# Patient Record
Sex: Male | Born: 1985
Health system: Southern US, Community
[De-identification: ages and names within clinical notes are randomized; demographics above are authoritative.]

## PROBLEM LIST (undated history)

## (undated) DIAGNOSIS — Z6830 Body mass index (BMI) 30.0-30.9, adult: Secondary | ICD-10-CM

## (undated) DIAGNOSIS — I10 Essential (primary) hypertension: Secondary | ICD-10-CM

## (undated) HISTORY — DX: Body mass index (BMI) 30.0-30.9, adult: Z68.30

## (undated) HISTORY — PX: WISDOM TOOTH EXTRACTION: SHX21

---

## 2015-04-12 ENCOUNTER — Emergency Department (HOSPITAL_COMMUNITY): Payer: 59

## 2015-04-12 ENCOUNTER — Emergency Department (HOSPITAL_COMMUNITY)
Admission: EM | Admit: 2015-04-12 | Discharge: 2015-04-12 | Disposition: A | Discharge status: Home / Self Care | Payer: 59 | Attending: Emergency Medicine | Admitting: Emergency Medicine

## 2015-04-12 ENCOUNTER — Encounter (HOSPITAL_COMMUNITY): Payer: Self-pay

## 2015-04-12 DIAGNOSIS — M542 Cervicalgia: Secondary | ICD-10-CM | POA: Diagnosis present

## 2015-04-12 DIAGNOSIS — I10 Essential (primary) hypertension: Secondary | ICD-10-CM | POA: Insufficient documentation

## 2015-04-12 DIAGNOSIS — Z79899 Other long term (current) drug therapy: Secondary | ICD-10-CM | POA: Diagnosis not present

## 2015-04-12 HISTORY — DX: Essential (primary) hypertension: I10

## 2015-04-12 LAB — I-STAT CHEM 8, ED
BUN: 17 mg/dL (ref 6–20)
Calcium, Ion: 1.18 mmol/L (ref 1.12–1.23)
Chloride: 104 mmol/L (ref 101–111)
Creatinine, Ser: 1.3 mg/dL — ABNORMAL HIGH (ref 0.61–1.24)
Glucose, Bld: 78 mg/dL (ref 65–99)
HCT: 44 % (ref 39.0–52.0)
Hemoglobin: 15 g/dL (ref 13.0–17.0)
Potassium: 3.9 mmol/L (ref 3.5–5.1)
Sodium: 142 mmol/L (ref 135–145)
TCO2: 27 mmol/L (ref 0–100)

## 2015-04-12 LAB — URINALYSIS, ROUTINE W REFLEX MICROSCOPIC
Bilirubin Urine: NEGATIVE
Glucose, UA: NEGATIVE mg/dL
Hgb urine dipstick: NEGATIVE
Ketones, ur: NEGATIVE mg/dL
Leukocytes, UA: NEGATIVE
Nitrite: NEGATIVE
Protein, ur: NEGATIVE mg/dL
Specific Gravity, Urine: 1.026 (ref 1.005–1.030)
pH: 7.5 (ref 5.0–8.0)

## 2015-04-12 MED ORDER — IBUPROFEN 800 MG PO TABS: 800.0000 mg | ORAL_TABLET | Freq: Three times a day (TID) | ORAL | Status: DC

## 2015-04-12 MED ORDER — CYCLOBENZAPRINE HCL 10 MG PO TABS: 10.0000 mg | ORAL_TABLET | Freq: Every day | ORAL | Status: DC

## 2015-04-12 NOTE — ED Notes (Addendum)
Pt here with neck pain since Friday.  Pt does not recall an injury.  Woke up with the pain.  Pt able to turn neck in triage.  However, no full normal rotation.  No fever or other symptoms. Pt wants blood and urine test.  Pt states he always gotten up at night x 2 since high school.  Chronic symptom since. No recent changes in that.  Was told that he needs to have that checked in the future.  Pt also wanted routine blood work. When told that we do not do routine blood work, pt proceeded to get a Dr. >>>>  On the phone that he knows.  Not a provider for patient's care but a friend.  She did not know that we do not do routine blood work here and felt pt neck pain could be meningitis.

## 2015-04-12 NOTE — ED Provider Notes (Signed)
History  By signing my name below, I, Joseph Santiago, attest that this documentation has been prepared under the direction and in the presence of Joseph Loan, PA-C. Electronically Signed: Marlowe Santiago, ED Scribe. 04/12/2015. 3:18 PM  Chief Complaint  Patient presents with  . Neck Pain   The history is provided by the patient and medical records. No language interpreter was used.    HPI Comments:  Joseph Santiago is a 30 y.o. male who presents to the Emergency Department complaining of neck pain that began two days ago. He reports gradual onset moderate HA four days ago that has resolved.  Denies thunderclap headache.  He states he woke up three days ago with neck stiffness and pain. He states he received a massage yesterday with no significant relief of the symptoms. He applied [facility] to the area with minimal relief and has taken Aleve. Attempting to turn his neck left and right increase the pain. He denies alleviating factors. He denies fever, chills, nausea, vomiting, confusion, disorientation, cough, sore throat, abdominal pain, numbness, tingling or weakness of any extremity. He denies any trauma, injury or fall. He denies any recent chiropractic care. Pt also expresses concern of frequent urination for the past nine years. He denies hematuria or dysuria. He requests urinalysis and has a list of blood tests he is requesting.  Past Medical History  Diagnosis Date  . Hypertension    History reviewed. No pertinent past surgical history. History reviewed. No pertinent family history. Social History  Substance Use Topics  . Smoking status: Never Smoker   . Smokeless tobacco: None  . Alcohol Use: No    Review of Systems A complete 10 system review of systems was obtained and all systems are negative except as noted in the HPI and PMH.   Allergies  Bactrim  Home Medications   Prior to Admission medications   Medication Sig Start Date End Date Taking? Authorizing Provider  b  complex vitamins tablet Take 1 tablet by mouth daily.   Yes Historical Provider, MD  Multiple Vitamins-Minerals (MULTIVITAMIN WITH MINERALS) tablet Take 1 tablet by mouth daily.   Yes Historical Provider, MD  cyclobenzaprine (FLEXERIL) 10 MG tablet Take 1 tablet (10 mg total) by mouth at bedtime. 04/12/15   Joseph Loan, PA-C  ibuprofen (ADVIL,MOTRIN) 800 MG tablet Take 1 tablet (800 mg total) by mouth 3 (three) times daily. 04/12/15   Joseph Loan, PA-C   Triage Vitals: BP 118/75 mmHg  Pulse 68  Temp(Src) 98.6 F (37 C) (Oral)  Resp 14  SpO2 99% Physical Exam  Constitutional: He is oriented to person, place, and time. He appears well-developed and well-nourished.  Non-toxic appearance. He does not have a sickly appearance. He does not appear ill.  HENT:  Head: Normocephalic and atraumatic.  Mouth/Throat: Oropharynx is clear and moist.  Eyes: Conjunctivae are normal. Pupils are equal, round, and reactive to light.  Neck: Normal range of motion. Neck supple.  Cardiovascular: Normal rate, regular rhythm and normal heart sounds.   No murmur heard. Pulmonary/Chest: Effort normal and breath sounds normal. No accessory muscle usage or stridor. No respiratory distress. He has no wheezes. He has no rhonchi. He has no rales.  Abdominal: Soft. Bowel sounds are normal. He exhibits no distension. There is no tenderness.  Musculoskeletal: Normal range of motion.  Lymphadenopathy:    He has no cervical adenopathy.  Neurological: He is alert and oriented to person, place, and time.  Speech clear without dysarthria.  Cranial nerves grossly  intact.  Strength and sensation intact throughout upper and lower extremities.  No pronator drift. Gait normal.   Skin: Skin is warm and dry.  Psychiatric: He has a normal mood and affect. His behavior is normal.    ED Course  Procedures (including critical care time) DIAGNOSTIC STUDIES: Oxygen Saturation is 99% on RA, normal by my interpretation.   COORDINATION OF  CARE: 1:56 PM- Will order urinalysis and C-Spine X-Ray. Advised pt to follow up with primary care to get requested blood tests. Offered pt Ibuprofen prior to imaging but he declined. Pt verbalizes understanding and agrees to plan.  Medications - No data to display  Labs Review Labs Reviewed  URINALYSIS, ROUTINE W REFLEX MICROSCOPIC (NOT AT [facility]) - Abnormal; Notable for the following:    APPearance CLOUDY (*)    All other components within normal limits  I-STAT CHEM 8, ED - Abnormal; Notable for the following:    Creatinine, Ser 1.30 (*)    All other components within normal limits    Imaging Review Dg Cervical Spine Complete  04/12/2015  CLINICAL DATA:  One day history of cervicalgia EXAM: CERVICAL SPINE - COMPLETE 4+ VIEW COMPARISON:  None. FINDINGS: Frontal, lateral, open-mouth odontoid, and bilateral oblique views were obtained. There is no fracture or spondylolisthesis. Prevertebral soft tissues and predental space regions are normal. The disc spaces appear normal. There is no appreciable exit foraminal narrowing on the oblique views. IMPRESSION: No fracture or spondylolisthesis.  No apparent arthropathy. Electronically Signed   By: Lowella Grip III M.D.   On: 04/12/2015 14:47   I have personally reviewed and evaluated these images and lab results as part of my medical decision-making.   EKG Interpretation None      MDM   Final diagnoses:  Neck pain    Healthy male with neck pain.  Doubt SAH. Headache has resolved, and was not sudden thunderclap in nature. Normal neuro exam.  Doubt meningitis.  VSS, NAD.  No systemic symptoms.  I suspect this is musculoskeletal.  Labs unremarkable. Plain films of cervical spine negative. Plan to discharge home with flexeril and ibuprofen. Follow up PCP. Discussed return precautions. Patient agrees an Engineer, structural the above plan for discharge.Case has been discussed with Dr. Audie Pinto who agrees with the above plan for discharge.    I  personally performed the services described in this documentation, which was scribed in my presence. The recorded information has been reviewed and is accurate.     Joseph Loan, PA-C 04/12/15 1537  Leonard Schwartz, MD 04/14/15 [id number]

## 2015-04-12 NOTE — Discharge Instructions (Signed)
Cervical Strain and Sprain With Rehab  Cervical strain and sprain are injuries that commonly occur with "whiplash" injuries. Whiplash occurs when the neck is forcefully whipped backward or forward, such as during a motor vehicle accident or during contact sports. The muscles, ligaments, tendons, discs, and nerves of the neck are susceptible to injury when this occurs.  RISK FACTORS  Risk of having a whiplash injury increases if:  · Osteoarthritis of the spine.  · Situations that make head or neck accidents or trauma more likely.  · High-risk sports (football, rugby, wrestling, hockey, auto racing, gymnastics, diving, contact karate, or boxing).  · Poor strength and flexibility of the neck.  · Previous neck injury.  · Poor tackling technique.  · Improperly fitted or padded equipment.  SYMPTOMS   · Pain or stiffness in the front or back of neck or both.  · Symptoms may present immediately or up to 24 hours after injury.  · Dizziness, headache, nausea, and vomiting.  · Muscle spasm with soreness and stiffness in the neck.  · Tenderness and swelling at the injury site.  PREVENTION  · Learn and use proper technique (avoid tackling with the head, spearing, and head-butting; use proper falling techniques to avoid landing on the head).  · Warm up and stretch properly before activity.  · Maintain physical fitness:    Strength, flexibility, and endurance.    Cardiovascular fitness.  · Wear properly fitted and padded protective equipment, such as padded soft collars, for participation in contact sports.  PROGNOSIS   Recovery from cervical strain and sprain injuries is dependent on the extent of the injury. These injuries are usually curable in 1 week to 3 months with appropriate treatment.   RELATED COMPLICATIONS   · Temporary numbness and weakness may occur if the nerve roots are damaged, and this may persist until the nerve has completely healed.  · Chronic pain due to frequent recurrence of symptoms.  · Prolonged healing,  especially if activity is resumed too soon (before complete recovery).  TREATMENT   Treatment initially involves the use of ice and medication to help reduce pain and inflammation. It is also important to perform strengthening and stretching exercises and modify activities that worsen symptoms so the injury does not get worse. These exercises may be performed at home or with a therapist. For patients who experience severe symptoms, a soft, padded collar may be recommended to be worn around the neck.   Improving your posture may help reduce symptoms. Posture improvement includes pulling your chin and abdomen in while sitting or standing. If you are sitting, sit in a firm chair with your buttocks against the back of the chair. While sleeping, try replacing your pillow with a small towel rolled to 2 inches in diameter, or use a cervical pillow or soft cervical collar. Poor sleeping positions delay healing.   For patients with nerve root damage, which causes numbness or weakness, the use of a cervical traction apparatus may be recommended. Surgery is rarely necessary for these injuries. However, cervical strain and sprains that are present at birth (congenital) may require surgery.  MEDICATION   · If pain medication is necessary, nonsteroidal anti-inflammatory medications, such as aspirin and ibuprofen, or other minor pain relievers, such as acetaminophen, are often recommended.  · Do not take pain medication for 7 days before surgery.  · Prescription pain relievers may be given if deemed necessary by your caregiver. Use only as directed and only as much as you need.    HEAT AND COLD:   · Cold treatment (icing) relieves pain and reduces inflammation. Cold treatment should be applied for 10 to 15 minutes every 2 to 3 hours for inflammation and pain and immediately after any activity that aggravates your symptoms. Use ice packs or an ice massage.  · Heat treatment may be used prior to performing the stretching and  strengthening activities prescribed by your caregiver, physical therapist, or athletic trainer. Use a heat pack or a warm soak.  SEEK MEDICAL CARE IF:   · Symptoms get worse or do not improve in 2 weeks despite treatment.  · New, unexplained symptoms develop (drugs used in treatment may produce side effects).  EXERCISES  RANGE OF MOTION (ROM) AND STRETCHING EXERCISES - Cervical Strain and Sprain  These exercises may help you when beginning to rehabilitate your injury. In order to successfully resolve your symptoms, you must improve your posture. These exercises are designed to help reduce the forward-head and rounded-shoulder posture which contributes to this condition. Your symptoms may resolve with or without further involvement from your physician, physical therapist or athletic trainer. While completing these exercises, remember:   · Restoring tissue flexibility helps normal motion to return to the joints. This allows healthier, less painful movement and activity.  · An effective stretch should be held for at least 20 seconds, although you may need to begin with shorter hold times for comfort.  · A stretch should never be painful. You should only feel a gentle lengthening or release in the stretched tissue.  STRETCH- Axial Extensors  · Lie on your back on the floor. You may bend your knees for comfort. Place a rolled-up hand towel or dish towel, about 2 inches in diameter, under the part of your head that makes contact with the floor.  · Gently tuck your chin, as if trying to make a "double chin," until you feel a gentle stretch at the base of your head.  · Hold __________ seconds.  Repeat __________ times. Complete this exercise __________ times per day.   STRETCH - Axial Extension   · Stand or sit on a firm surface. Assume a good posture: chest up, shoulders drawn back, abdominal muscles slightly tense, knees unlocked (if standing) and feet hip width apart.  · Slowly retract your chin so your head slides back  and your chin slightly lowers. Continue to look straight ahead.  · You should feel a gentle stretch in the back of your head. Be certain not to feel an aggressive stretch since this can cause headaches later.  · Hold for __________ seconds.  Repeat __________ times. Complete this exercise __________ times per day.  STRETCH - Cervical Side Bend   · Stand or sit on a firm surface. Assume a good posture: chest up, shoulders drawn back, abdominal muscles slightly tense, knees unlocked (if standing) and feet hip width apart.  · Without letting your nose or shoulders move, slowly tip your right / left ear to your shoulder until your feel a gentle stretch in the muscles on the opposite side of your neck.  · Hold __________ seconds.  Repeat __________ times. Complete this exercise __________ times per day.  STRETCH - Cervical Rotators   · Stand or sit on a firm surface. Assume a good posture: chest up, shoulders drawn back, abdominal muscles slightly tense, knees unlocked (if standing) and feet hip width apart.  · Keeping your eyes level with the ground, slowly turn your head until you feel a gentle stretch along   the back and opposite side of your neck.  · Hold __________ seconds.  Repeat __________ times. Complete this exercise __________ times per day.  RANGE OF MOTION - Neck Circles   · Stand or sit on a firm surface. Assume a good posture: chest up, shoulders drawn back, abdominal muscles slightly tense, knees unlocked (if standing) and feet hip width apart.  · Gently roll your head down and around from the back of one shoulder to the back of the other. The motion should never be forced or painful.  · Repeat the motion 10-20 times, or until you feel the neck muscles relax and loosen.  Repeat __________ times. Complete the exercise __________ times per day.  STRENGTHENING EXERCISES - Cervical Strain and Sprain  These exercises may help you when beginning to rehabilitate your injury. They may resolve your symptoms with or  without further involvement from your physician, physical therapist, or athletic trainer. While completing these exercises, remember:   · Muscles can gain both the endurance and the strength needed for everyday activities through controlled exercises.  · Complete these exercises as instructed by your physician, physical therapist, or athletic trainer. Progress the resistance and repetitions only as guided.  · You may experience muscle soreness or fatigue, but the pain or discomfort you are trying to eliminate should never worsen during these exercises. If this pain does worsen, stop and make certain you are following the directions exactly. If the pain is still present after adjustments, discontinue the exercise until you can discuss the trouble with your clinician.  STRENGTH - Cervical Flexors, Isometric  · Face a wall, standing about 6 inches away. Place a small pillow, a ball about 6-8 inches in diameter, or a folded towel between your forehead and the wall.  · Slightly tuck your chin and gently push your forehead into the soft object. Push only with mild to moderate intensity, building up tension gradually. Keep your jaw and forehead relaxed.  · Hold 10 to 20 seconds. Keep your breathing relaxed.  · Release the tension slowly. Relax your neck muscles completely before you start the next repetition.  Repeat __________ times. Complete this exercise __________ times per day.  STRENGTH- Cervical Lateral Flexors, Isometric   · Stand about 6 inches away from a wall. Place a small pillow, a ball about 6-8 inches in diameter, or a folded towel between the side of your head and the wall.  · Slightly tuck your chin and gently tilt your head into the soft object. Push only with mild to moderate intensity, building up tension gradually. Keep your jaw and forehead relaxed.  · Hold 10 to 20 seconds. Keep your breathing relaxed.  · Release the tension slowly. Relax your neck muscles completely before you start the next  repetition.  Repeat __________ times. Complete this exercise __________ times per day.  STRENGTH - Cervical Extensors, Isometric   · Stand about 6 inches away from a wall. Place a small pillow, a ball about 6-8 inches in diameter, or a folded towel between the back of your head and the wall.  · Slightly tuck your chin and gently tilt your head back into the soft object. Push only with mild to moderate intensity, building up tension gradually. Keep your jaw and forehead relaxed.  · Hold 10 to 20 seconds. Keep your breathing relaxed.  · Release the tension slowly. Relax your neck muscles completely before you start the next repetition.  Repeat __________ times. Complete this exercise __________ times per day.    All of your joints have less wear and tear when properly supported by a spine with good posture. This means you will experience a healthier, less painful body.  Correct posture must be practiced with all of your activities, especially prolonged sitting and standing. Correct posture is as important when doing repetitive low-stress activities (typing) as it is when doing a single heavy-load activity (lifting). PROLONGED STANDING WHILE SLIGHTLY LEANING FORWARD When completing a task that requires you to lean forward while standing in one  place for a long time, place either foot up on a stationary 2- to 4-inch high object to help maintain the best posture. When both feet are on the ground, the low back tends to lose its slight inward curve. If this curve flattens (or becomes too large), then the back and your other joints will experience too much stress, fatigue more quickly, and can cause pain.  RESTING POSITIONS Consider which positions are most painful for you when choosing a resting position. If you have pain with flexion-based activities (sitting, bending, stooping, squatting), choose a position that allows you to rest in a less flexed posture. You would want to avoid curling into a fetal position on your side. If your pain worsens with extension-based activities (prolonged standing, working overhead), avoid resting in an extended position such as sleeping on your stomach. Most people will find more comfort when they rest with their spine in a more neutral position, neither too rounded nor too arched. Lying on a non-sagging bed on your side with a pillow between your knees, or on your back with a pillow under your knees will often provide some relief. Keep in mind, being in any one position for a prolonged period of time, no matter how correct your posture, can still lead to stiffness. WALKING Walk with an upright posture. Your ears, shoulders, and hips should all line up. OFFICE WORK When working at a desk, create an environment that supports good, upright posture. Without extra support, muscles fatigue and lead to excessive strain on joints and other tissues. CHAIR:  A chair should be able to slide under your desk when your back makes contact with the back of the chair. This allows you to work closely.  The chair's height should allow your eyes to be level with the upper part of your monitor and your hands to be slightly lower than your elbows.  Body position:  Your feet should make contact with the floor. If this is not  possible, use a foot rest.  Keep your ears over your shoulders. This will reduce stress on your neck and low back.   This information is not intended to replace advice given to you by your health care provider. Make sure you discuss any questions you have with your health care provider.   Document Released: 02/14/2005 Document Revised: 03/07/2014 Document Reviewed: 05/29/2008 Elsevier Interactive Patient Education 2016 [employer].   Emergency Department Resource Guide 1) Find a Doctor and Pay Out of Pocket Although you won't have to find out who is covered by your insurance plan, it is a good idea to ask around and get recommendations. You will then need to call the office and see if the doctor you have chosen will accept you as a new patient and what types of options they offer for patients who are self-pay. Some doctors offer discounts or will set up payment plans for their patients who do not have insurance, but you will need to ask so you aren't surprised when  you get to your appointment.  2) Contact Your Local Health Department Not all health departments have doctors that can see patients for sick visits, but many do, so it is worth a call to see if yours does. If you don't know where your local health department is, you can check in your phone book. The CDC also has a tool to help you locate your state's health department, and many state websites also have listings of all of their local health departments.  3) Find a [facility] If your illness is not likely to be very severe or complicated, you may want to try a walk in clinic. These are popping up all over the country in pharmacies, drugstores, and shopping centers. They're usually staffed by nurse practitioners or physician assistants that have been trained to treat common illnesses and complaints. They're usually fairly quick and inexpensive. However, if you have serious medical issues or chronic medical problems, these are probably  not your best option.  No Primary Care Doctor: - Call Health Connect at  [phone] - they can help you locate a primary care doctor that  accepts your insurance, provides certain services, etc. - Physician Referral Service- [phone]  Chronic Pain Problems: Organization         Address  Phone   Notes  [facility]  [phone] Patients need to be referred by their primary care doctor.   Medication Assistance: Organization         Address  Phone   Notes  [facility] Medication [facility] [address] [place], [place] [address] [phone] --Must be a resident of [facility] -- Must have NO insurance coverage whatsoever (no Medicaid/ Medicare, etc.) -- The pt. MUST have a primary care doctor that directs their care regularly and follows them in the community   Med[facility]ist  [phone]   [employer]  [phone]    Agencies that provide inexpensive medical care: Organization         Address  Phone   Notes  [place]  [phone]   [facility]    [phone]   [facility] [place], [place] [address] [phone]   [place] [address] [place] [phone]   Planned Parenthood    [phone]   [facility]    [phone]   [place] [address], [place] Phone:  [phone], Fax:  [phone] Hours of Operation:  9 am - 6 pm, M-F.  Also accepts Medicaid/Medicare and self-pay.  [facility] [place], [address], [place] Phone: [phone], Fax: [phone]. Hours of Operation:  8:30 am - 5:30 pm, M-F.  Also accepts Medicaid and self-pay.  [facility] [place] [address], [place] Phone: [phone]   [place], [place], [place] [phone], Ext. [phone] Mondays & Thursdays: 7-9 AM.   First 15 patients are seen on a first come, first serve basis.    [place] Providers:  Organization         Address  Phone   Notes  [facility] [address],  [phone] Also accepts self-pay patients.  [place], [place]  [phone]   [place]  [place], [address], [place] [phone]   [place] [address], [place] [phone]   Lucianne Lei [address], [place]   [phone] Only accepts [place] Access [place] patients after they have their name applied to their card.   Self-Pay (no insurance) in [facility]:  Organization         Address  Phone   Notes  Sickle Cell Patients, [facility] [place] [phone]   [facility] [place] [phone]   [facility] [place]  [place], [place], [place] [phone]   [facility]/Dr. Osei-Bonsu  [address] [place] or [address], [place] [phone] Phone number for both [place] and [place] locations is the same.  Urgent Medical and [facility] [address], [place] [phone]   [facility] [address], [place] or [address] [phone]   [facility] [address], [place] [phone], phone; [phone], fax Sees patients 1st and 3rd Saturday of every month.  Must not qualify for public or private insurance (i.e. Medicaid, Medicare, [place] Health Choice, Veterans' Benefits)  Household income should be no more than 200% of the poverty level The clinic cannot treat you if you are pregnant or think you are pregnant  Sexually transmitted diseases are not treated at the clinic.    Dental  Care: Organization         Address  Phone  Notes  [facility] [place] [phone] Accepts children up to age 45 who are enrolled in [place] or [place]; pregnant women with a Medicaid card; and children who have applied for Medicaid or [place] Health Choice, but were declined, whose parents can pay a reduced fee at time of service.  [facility] Department of [facility]  [address], [place] [phone] Accepts children up to age 36 who are enrolled in [place] or [place]; pregnant women with a Medicaid card; and children who have applied for Medicaid or [place] Health Choice, but were declined, whose parents can pay a reduced fee at time of service.  [place] Adult Dental Access PROGRAM  [place] [phone] Patients are seen by appointment only. Walk-ins are not accepted. [place] will see patients 32 years of age and older. Monday - Tuesday (8am-5pm) Most Wednesdays (8:30-5pm) $30 per visit, cash only  [facility] Access PRO[facility]  [address], [facility] [phone] Patients are seen by appointment only. Walk-ins are not accepted. [place] will see patients 42 years of age and older. One Wednesday Evening (Monthly: Volunteer Based).  $30 per visit, cash only  [place]  [phone] for adults; Children under age 54, call Graduate Pediatric Dentistry at [phone]. Children aged 43-14, please call [phone] to request a pediatric application.  Dental services are provided in all areas of dental care including fillings, crowns and bridges, complete and partial dentures, implants, gum treatment, root canals, and extractions. Preventive care is also provided. Treatment is provided to both adults and children. Patients are selected via a lottery and there is often a waiting list.   [phone]  [facility] [address] [place]  [phone] [email or web address]   [facility] [address] [place], [place] [phone], Ext. [phone] Second and Fourth Thursday of each month, opens at 6:30 AM; Clinic ends at 9 AM.  Patients are seen on a first-come first-served basis, and a limited number are seen during each clinic.   [facility]  [address] [place], [place] [phone]   Eligibility Requirements You must have lived in [place], [place], or [place] counties for at least the last three months.   You cannot be eligible for state or federal sponsored [employer], including [employer], [place], or [employer].   You generally cannot be eligible for healthcare insurance through your employer.    How to apply: Eligibility screenings are held every Tuesday and Wednesday afternoon from 1:00 pm until 4:00 pm. You do not need an appointment for the interview!  [facility] [address] [place], [place]   [place]  [place] Department  [place]  [phone]    Behavioral Health Resources in the Community: Intensive Outpatient Programs Organization         Address  Phone  Notes  Ada [place]. [address] [place], [place] [phone]   [facility] [address], [place], [place]   ADS: Alcohol & Drug Svcs [address], [place], [place]   [place] [address],  [place], [place] or [phone]   Substance Abuse Resources Organization         Address  Phone  Notes  Alcohol and Drug Services  [phone]   [place]  [phone]   [facility] [place]   Chinita Pester  [phone]   Residential & Outpatient Substance Abuse Program  [phone]     Psychological Services Organization         Address  Phone  Notes  [facility] [place]  [place]  [phone]   [place] [address], [place] or [phone]    Mobile Crisis Teams Organization         Address  Phone  Notes  [facility], Mobile Crisis Care Unit  [phone]   [facility]  [address] [place], [place]   [place] [address], [place] [phone]    Self-Help/Support Groups Organization         Address  Phone             Notes  [place]. of [place] - variety of support groups  [place] Call for more information  Narcotics Anonymous (NA), [facility] [address], [employer] [place]  2 meetings at this location   Special educational needs teacher         Address  Phone  Notes  [facility] [place],    [place]  [phone]   [facility]  [address], [place] [phone], [place], [place]   [place], [place] [phone] Admissions: 8am-3pm M-F  Incentives Substance [place] [address]    [place], [place] [phone]   [facility] [address] [place] [place], [place], [place]   The [place].,  [place], [place]   [facility] - Intensive Outpatient [address] Kristeen [address], [place], [place]   [facility] ([place] [address]  [place], [place] [phone] or [phone]   [facility] Services (RTS) [address], [place], [place] Accepts Medicaid  [facility] [place] [address]  [place] [phone] Substance Abuse/Addiction Treatment   [facility] Organization         Address  Phone  Notes  [facility]  [phone]   Domenic Schwab, PhD [address] [place], [place]   [phone] or [phone]   [place] [employer], [place] [phone]   [facility] [address] [place], [place] [phone] Insurance/Medicaid/sponsorship through [facility] and Families [address]                                    [place], [place] [phone] [place] [address], [place] [phone]    Dr. Adele Schilder  [phone]   [facility] Dept. 1) [address] [place] 2) [place] 3)  [address] [place] [address], [place] [phone]  [phone]   [place] [phone] or [phone] (After Hours)

## 2018-09-04 ENCOUNTER — Other Ambulatory Visit: Payer: Self-pay | Admitting: Internal Medicine

## 2018-09-04 DIAGNOSIS — Z20822 Contact with and (suspected) exposure to covid-19: Secondary | ICD-10-CM

## 2018-09-09 LAB — NOVEL CORONAVIRUS, NAA: SARS-CoV-2, NAA: NOT DETECTED

## 2018-09-12 ENCOUNTER — Telehealth: Payer: Self-pay

## 2018-09-12 NOTE — Telephone Encounter (Signed)
Patient calling for covid-19 test results.

## 2018-09-12 NOTE — Telephone Encounter (Signed)
Provided Patient with result of covid -19 test results .  Patient voiced understanding.

## 2018-09-17 ENCOUNTER — Other Ambulatory Visit: Payer: Self-pay

## 2018-09-17 DIAGNOSIS — Z20822 Contact with and (suspected) exposure to covid-19: Secondary | ICD-10-CM

## 2018-09-17 NOTE — Addendum Note (Signed)
Addended by: Brigitte Pulse on: 09/17/2018 09:06 PM   Modules accepted: Orders

## 2018-09-20 LAB — NOVEL CORONAVIRUS, NAA: SARS-CoV-2, NAA: NOT DETECTED

## 2018-10-25 ENCOUNTER — Other Ambulatory Visit: Payer: Self-pay

## 2018-10-25 DIAGNOSIS — Z20822 Contact with and (suspected) exposure to covid-19: Secondary | ICD-10-CM

## 2018-10-27 LAB — NOVEL CORONAVIRUS, NAA: SARS-CoV-2, NAA: NOT DETECTED

## 2018-10-27 LAB — SPECIMEN STATUS REPORT

## 2018-11-08 ENCOUNTER — Ambulatory Visit (INDEPENDENT_AMBULATORY_CARE_PROVIDER_SITE_OTHER): Payer: 59 | Admitting: Family Medicine

## 2018-11-08 ENCOUNTER — Other Ambulatory Visit: Payer: Self-pay

## 2018-11-08 DIAGNOSIS — E663 Overweight: Secondary | ICD-10-CM

## 2018-11-08 NOTE — Patient Instructions (Addendum)
1. Complete the Meal Planning form emailed to you today.    2. Eat at least 3 REAL meals and 1-2 snacks per day.  Aim for no more than 5 hours between eating.  Eat breakfast within one hour of getting up.  A REAL meal includes at least some protein, some starch, and vegetables and/or fruit.  OR: Would you serve this to a guest in your home, and call it a meal?  - Breakfast options on weekdays: Peanut butter, egg, or [place].chicken sandwich; 2 boiled eggs with 2 slices whwht toast; bean burrito.   3. Obtain twice the volume of vegetables as either protein or starchy foods for both lunch and dinner at least 6 days per week.    - TASTE PREFERENCES ARE LEARNED.  This means it will get easier to choose foods you know are good for you if you are exposed to them enough.    - Determine how you will track progress on your goals, e.g., checks on a calendar, recording in a notebook, or using a smartphone app.  Think about what will be easy enough to be sustainable, and satisfying (and motivating) for you do.     - Look over your objectives for medical nutrition therapy, and document what you want to get out of any follow-up appt.  You can bring this to follow-up or email it ahead of time.    - Follow-up: Thursday, October 15 at 4 PM via [email or web address] platform:    [email or web address]

## 2018-11-08 NOTE — Progress Notes (Signed)
Telehealth Encounter I connected with Joseph Santiago (MRN [id number]) on 11/08/2018 by MyChart video-enabled, HIPAA-compliant telemedicine application, verified that I am speaking with the correct person using two identifiers, and that the patient was in a private environment conducive to confidentiality.  I discussed the limitations of evaluation and management by telemedicine. The patient expressed understanding and agreed to proceed.  Provider was Kennith Center, PhD, RD, LDN, CEDRD Provider(s) located at home during this telehealth encounter; patient was at work Teaching laboratory technician at [employer]).   Appt start time: 1600 end time: 1700 (1 hour)  Reason for telehealth visit: Medical Nutrition Therapy for better managing his diet and improving self-care, including: 7-day plan for groceries, best restaurant options, "best" diets,  good snack options, easy vegetarian (lacto-ovo-pesce) cooking options, kcal needs and where should he get them from, cooking alternatives (how to cook), how fast to lose weight healthfully, ideal body wt for height, what foods should be avoided.  Desired outcome goals: Achieve & maintain healthy weight, which he defines as ~166 lb, and optimize health.    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  Used to train and eat specifically for sport  (8th grade through sophomore yr in college).  Family history of cancer, CVD, HTN, and DM.    Assessment:  Usual eating pattern: 3 meals and 1 snack per day. Frequent foods and beverages: alkaline water, (some diluted Naked Juice); [place], chx, brn rice, grn beans, corn, bread, salads, sandwiches.   Avoided foods: most red meat, poultry, most dairy, okra, pigs feet, sweets, packaged snack foods.   Usual physical activity: 60-75 wt training 3 X wk; ~25 min interval runs 2 X wk (Sat & Sun). Sleep: Estimates he gets 4-5 hrs per night.  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    24-hr recall:  (Up at 6:30 AM) B (8:30 AM)-  2 [employer], water Snk ( AM)-  --- L (12 PM)-  1 soy meat taco, cheese, tofu sour cream, 1 c grapes, water Snk (6 PM)-  1 soy meat taco, cheese, tofu sour cream, D (9 PM)-  1 1/2-2 c spaghetti w/ primavera sauce (no meat), 2 slc garlic bread, water, ~8 oz juice Snk ( PM)-  --- Typical day? Yes.    Intervention: Reviewed [name]' objectives for today's appt, and addressed questions that could be answered at this time.  Established behavioral goals and discussed importance of determining monitoring system for progress on goals.    For recommendations and goals, see Patient Instructions.    Follow-up: Telehealth visit on Oct 15 at 4 PM via [email or web address].    SYKES,JEANNIE

## 2018-11-27 ENCOUNTER — Ambulatory Visit (INDEPENDENT_AMBULATORY_CARE_PROVIDER_SITE_OTHER): Payer: 59 | Admitting: Student in an Organized Health Care Education/Training Program

## 2018-11-27 ENCOUNTER — Other Ambulatory Visit: Payer: Self-pay

## 2018-11-27 ENCOUNTER — Encounter: Payer: Self-pay | Admitting: Student in an Organized Health Care Education/Training Program

## 2018-11-27 DIAGNOSIS — R351 Nocturia: Secondary | ICD-10-CM | POA: Diagnosis not present

## 2018-11-27 DIAGNOSIS — N503 Cyst of epididymis: Secondary | ICD-10-CM | POA: Diagnosis not present

## 2018-11-27 DIAGNOSIS — E785 Hyperlipidemia, unspecified: Secondary | ICD-10-CM

## 2018-11-27 DIAGNOSIS — R3581 Nocturnal polyuria: Secondary | ICD-10-CM
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
## 2018-11-27 NOTE — Patient Instructions (Signed)
It was a pleasure to see you today!  To summarize our discussion for this visit:  It was nice to meet you thank you for coming in to establish care with me today.  Today we examined your testicle which looks like residual swelling from epididymitis and I am not concerned at this time.  Please come back if it changes at all.  For your prostatitis I will be prescribing you an antibiotic to take for 6 weeks.  Please expect a slow improvement in your urinary symptoms and if it does not improve by the end of this course please let me know and return to discuss other options.  We discussed your cholesterol today is being elevated so I would like to see if a increased activity level can impact that and if not we can discuss a healthy medication.  Some additional health maintenance measures we should update are: Health Maintenance Due  Topic Date Due  . HIV Screening  09/16/2002  .   Please return to our clinic to see me in about 4 weeks.  Call the clinic at [phone] if your symptoms worsen or you have any concerns.   Thank you for allowing me to take part in your care,  Dr. Doristine Mango   Prostatitis  Prostatitis is swelling of the prostate gland. The prostate helps to make semen. It is below a man's bladder, in front of the rectum. There are different types of prostatitis. Follow these instructions at home:   Take over-the-counter and prescription medicines only as told by your doctor.  If you were prescribed an antibiotic medicine, take it as told by your doctor. Do not stop taking the antibiotic even if you start to feel better.  If your doctor prescribed exercises, do them as directed.  Take sitz baths as told by your doctor. To take a sitz bath, sit in warm water that is deep enough to cover your hips and butt.  Keep all follow-up visits as told by your doctor. This is important. Contact a doctor if:  Your symptoms get worse.  You have a fever. Get help right away  if:  You have chills.  You feel sick to your stomach (nauseous).  You throw up (vomit).  You feel light-headed.  You feel like you might pass out (faint).  You cannot pee (urinate).  You have blood or clumps of blood (blood clots) in your pee (urine). This information is not intended to replace advice given to you by your health care provider. Make sure you discuss any questions you have with your health care provider. Document Released: 08/16/2011 Document Revised: 01/27/2017 Document Reviewed: 11/05/2015 Elsevier Patient Education  2020 [employer].

## 2018-11-28 ENCOUNTER — Encounter: Payer: Self-pay | Admitting: Student in an Organized Health Care Education/Training Program

## 2018-11-28 DIAGNOSIS — N503 Cyst of epididymis: Secondary | ICD-10-CM | POA: Insufficient documentation

## 2018-11-28 DIAGNOSIS — R351 Nocturia: Secondary | ICD-10-CM | POA: Insufficient documentation

## 2018-11-28 DIAGNOSIS — R3581 Nocturnal polyuria: Secondary | ICD-10-CM | POA: Insufficient documentation

## 2018-11-28 MED ORDER — LEVOFLOXACIN 500 MG PO TABS: 500.0000 mg | ORAL_TABLET | Freq: Every day | ORAL | 0 refills | Status: AC

## 2018-11-28 MED FILL — levoFLOXacin 500 MG TABS: 500 | 45 days supply | Qty: 45 | Fill #0

## 2018-11-28 NOTE — Assessment & Plan Note (Signed)
Epididymal cyst on right testes present and unchanged since high school.  Nontender. Very low suspicion for testicular cancer at this time. -Counseled patient on return precautions if having any tenderness or change in morphology -If experiencing any issues will ultrasound testes

## 2018-11-28 NOTE — Assessment & Plan Note (Addendum)
Chronic and unchanged.  Differential diagnosis includes chronic prostatitis, polydipsia, detrusor dysfunction Based on physical exam- will treat for chronic prostatitis.  6 weeks of levofloxacin  Return in 6 weeks or sooner if not improving. Will provide with detrusor exercises and possible referral to urology at that time if not improved. Could also consider trial of antichol

## 2018-11-30 DIAGNOSIS — E785 Hyperlipidemia, unspecified: Secondary | ICD-10-CM | POA: Insufficient documentation

## 2018-11-30 NOTE — Assessment & Plan Note (Signed)
Reviewed lipid panel from cardiology as recorded in HPI. Discussed treatment options with patient. He prefers to do a trial of diet and exercise moderation to attempt to lower cholesterol. Counseled patient on methods to improve these numbers. Will return in ~3 months for recheck of lipids and will consider starting statin therapy if the panel is not improved.

## 2018-12-13 ENCOUNTER — Ambulatory Visit (INDEPENDENT_AMBULATORY_CARE_PROVIDER_SITE_OTHER): Payer: 59 | Admitting: Family Medicine

## 2018-12-13 DIAGNOSIS — E663 Overweight: Secondary | ICD-10-CM | POA: Diagnosis not present

## 2018-12-13 NOTE — Patient Instructions (Addendum)
Ways to help lower LDL: - Limit saturated fats (mostly animal fats, and also vegetable shortening, commercially fried foods, tropical fats such as palm and coconut).   Use primarily extra virgin olive oil, which is a mono-unsaturated fat.  This type of fat helps lower LDL without lowering HDL.   - DO eat plenty of:  - Fruits and vegetables. The soluble fiber in these plant foods can help lower LDL.   - Dried beans and peas (legumes), another source of soluble fiber.   - Unsalted nuts and seeds, which provide both poly- and mono-unsaturated fats.  - Fatty fish such as salmon and mackerel, excellent sources of omega-3 fats, which also help lower cholesterol (among other health benefits). Soy foods and meat analogues can be helpful as a replacement for meats or other protein sources of saturated fat.  (Be cautious about sodium levels of many of these products, however.  If you have high blood pressure, you'll want to limit your sodium intake to 1500 mg per day.)    Read the handout provided today on the OmniHeart trial, which explored ways to improve on the DASH diet.    Congrat's on doing a great job on your food choices, as well as completing a meal plan.   Your other goals remain the same: 1. Eat at least 3 REAL meals and 1-2 snacks per day.  Aim for no more than 5 hours between eating.   2. Obtain twice the volume of vegetables as either protein or starchy foods for both lunch and dinner at least 6 days per week.

## 2018-12-13 NOTE — Progress Notes (Signed)
Telehealth Encounter I connected with Joseph Santiago (MRN [id number]) on 12/13/2018 by MyChart video-enabled, HIPAA-compliant telemedicine application, verified that I am speaking with the correct person using two identifiers, and that the patient was in a private environment conducive to confidentiality.  I discussed the limitations of evaluation and management by telemedicine. The patient expressed understanding and agreed to proceed.  Provider was Kennith Center, PhD, RD, LDN, CEDRD Provider(s) located at [facility] during this telehealth encounter; patient was at work Teaching laboratory technician at [employer]).   Appt start time: 1600 end time: 1700 (1 hour)  Reason for telehealth visit: Medical Nutrition Therapy for better managing his diet and improving self-care, including: 7-day plan for groceries, best restaurant options, "best" diets,  good snack options, easy vegetarian (lacto-ovo-pesco) cooking options, kcal needs and where he can get them, cooking alternatives (how to cook), how to lose weight healthfully, ideal body wt for height, what foods should be avoided.  Desired outcome goals: Achieve & maintain healthy weight, which he defines as ~166 lb, and optimize health and lipids.      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  Used to train and eat specifically for sport  (8th grade through sophomore yr in college).  Family history of cancer, CVD, HTN, and DM.    Assessment:  Usual eating pattern: 3 meals and 1-2 snack per day. Usual physical activity: 90 min workout 3 X wk (10-30 min cardio, >60 min resistance. Sleep: Estimates he gets 6-7 hrs per night, an increase from previous month.    24-hr recall:  (Up at 7 AM) B (8 AM)-  2-egg sandwich w/ spinach, water Snk (11 AM)-  1 c strawberries, 1 grapes, water L (12 PM)-  1 large chx-kale salad, vinaigrette, 8 oz apple juice, water Snk (  PM)-  --- D (5 PM)-  3 c veget chili, water  Snk (9 PM)-  1 slc crustless sweet potato pie Typical day? Yes.    Intervention: Reviewed Joseph Santiago' meal plan he had emailed to me this week, and made a few suggestions for modification.  Also discussed food choices that are associated with lowering LDL, and confirmed that he would like to keep working on the same behavioral goals.    For recommendations and goals, see Patient Instructions.    Follow-up: Telehealth visit via Doxy.me TBA.  Patient will call for appt.    Joseph Santiago,Joseph Santiago

## 2018-12-26 ENCOUNTER — Encounter: Payer: Self-pay | Admitting: Student in an Organized Health Care Education/Training Program

## 2018-12-26 ENCOUNTER — Other Ambulatory Visit: Payer: Self-pay

## 2018-12-26 ENCOUNTER — Ambulatory Visit (INDEPENDENT_AMBULATORY_CARE_PROVIDER_SITE_OTHER): Payer: 59 | Admitting: Student in an Organized Health Care Education/Training Program

## 2018-12-26 VITALS — BP 112/78 | HR 67 | Ht 67.0 in | Wt 190.0 lb

## 2018-12-26 DIAGNOSIS — F418 Other specified anxiety disorders: Secondary | ICD-10-CM

## 2018-12-26 DIAGNOSIS — E785 Hyperlipidemia, unspecified: Secondary | ICD-10-CM | POA: Diagnosis not present

## 2018-12-26 DIAGNOSIS — L720 Epidermal cyst: Secondary | ICD-10-CM | POA: Diagnosis not present

## 2018-12-26 DIAGNOSIS — R351 Nocturia: Secondary | ICD-10-CM

## 2018-12-26 DIAGNOSIS — R3581 Nocturnal polyuria: Secondary | ICD-10-CM

## 2018-12-26 NOTE — Assessment & Plan Note (Signed)
Unchanged with previous treatment so will discontinue the antibiotics at this time and refer to urology.

## 2018-12-26 NOTE — Patient Instructions (Signed)
It was a pleasure to see you today!  To summarize our discussion for this visit:  For my concerns today I want to discuss your urinary symptoms which have not been changed so I would recommend to stop taking the antibiotic and I will refer you to a urologist.  If you do not hear about the referral within 2 weeks please message me to let me know so I can follow-up on that.  Your blood pressure is in good limits today.  We checked your lipids and we will follow up on what the next steps are on managing that once the results return.  The bump on your head looks to be benign.  Continue to monitor it and let me know if it changes shape or character.  Please return to our clinic to see me in 1 year or sooner if you need anything.  Call the clinic at [phone] if your symptoms worsen or you have any concerns.   Thank you for allowing me to take part in your care,  Dr. Doristine Mango

## 2018-12-26 NOTE — Assessment & Plan Note (Addendum)
Had plan to check lipid panel about 3 months after last visit but patient is anxious to find out what his cholesterol looks like now. -We will obtain lipid panel today and discussed with patient whether or not there is a need for medication. - triglycerides only level mildly elevated and patient was not fasting. Informed patient and released results- he is okay to not retest at this time. - continue on diet and exercise regimen

## 2018-12-26 NOTE — Assessment & Plan Note (Signed)
Exam today reassuring as benign and gave patient reassurance and signs to monitor that would prompt reexamination urgently. Offered options for minimizing the cyst if becomes bothersome and patient declined

## 2018-12-26 NOTE — Progress Notes (Signed)
   Subjective:    Patient ID: Joseph Santiago, male    DOB: [birth year], 33 y.o.   MRN: [id number]  CC: f/u  HPI:  Nocturnal polyuria-unchanged despite adherence with treatment.  Cholesterol-patient has made diet and exercise changes and would like to recheck his lipids today. He is not fasting.  Patient is wondering if he should get his stool checked today since he has never had it checked before.  He denies any abdominal cramping, blood in stool, recent travel, difficulty with passing a bowel movement, family history of colon cancer.  Has concerns about a lump on his forehead which has been present for about 2 years and is unchanged.  The bump does not hurt or itch or drain or bleed.  Seems to get in the way when he is getting his haircut.  Headaches-which are chronic in nature have improved since patient has made changes in his diet and exercise.  Hypertension-patient endorses having a history of hypertension but takes no medications nor has he ever. He was seen by a cardiologist who did an extensive workup of his CV system which was unremarkable overall (reviewed in care everywhere). Blood pressure today is in good limits 112/78.  Patient denies shortness of breath, chest pain, leg swelling.  Smoking status reviewed   ROS: pertinent noted in the HPI   I have personally reviewed pertinent past medical history, surgical, family, and social history as appropriate.  Objective:  BP 112/78   Pulse 67   Ht 5\' 7"  (1.702 m)   Wt 190 lb (86.2 kg)   SpO2 95%   BMI 29.76 kg/m   Vitals and nursing note reviewed  General: NAD, pleasant, able to participate in exam Cardiac: RRR, S1 S2 present. normal heart sounds, no murmurs. Respiratory: CTAB, normal effort, No wheezes, rales or rhonchi Extremities: no edema or cyanosis. Skin: warm and dry, no rashes noted  Head: Solitary firm nodule  ~1mm in center of hairline on forehead which is nontender, nonindurated, nonfluctuant, is mobile and  smooth.  Negative for skin changes above.  Neuro: alert, no obvious focal deficits Psych: Normal affect and mildly anxious mood  Assessment & Plan:   Nocturnal polyuria Unchanged with previous treatment so will discontinue the antibiotics at this time and refer to urology.  Hyperlipemia Had plan to check lipid panel about 3 months after last visit but patient is anxious to find out what his cholesterol looks like now. -We will obtain lipid panel today and discussed with patient whether or not there is a need for medication. - triglycerides only level mildly elevated and patient was not fasting. Informed patient and released results- he is okay to not retest at this time. - continue on diet and exercise regimen  Epidermoid cyst Exam today reassuring as benign and gave patient reassurance and signs to monitor that would prompt reexamination urgently. Offered options for minimizing the cyst if becomes bothersome and patient declined  Orders Placed This Encounter  Procedures  . Lipid Panel  . Ambulatory referral to Urology    Referral Priority:   Routine    Referral Type:   Consultation    Referral Reason:   Specialty Services Required    Requested Specialty:   Urology    Number of Visits Requested:   [place] PGY-2

## 2018-12-27 ENCOUNTER — Other Ambulatory Visit: Payer: Self-pay | Admitting: Student in an Organized Health Care Education/Training Program

## 2018-12-27 DIAGNOSIS — E785 Hyperlipidemia, unspecified: Secondary | ICD-10-CM

## 2018-12-27 LAB — LIPID PANEL
Chol/HDL Ratio: 3.8 ratio (ref 0.0–5.0)
Cholesterol, Total: 172 mg/dL (ref 100–199)
HDL: 45 mg/dL (ref >39)
LDL Chol Calc (NIH): 97 mg/dL (ref 0–99)
Triglycerides: 174 mg/dL — ABNORMAL HIGH (ref 0–149)
VLDL Cholesterol Cal: 30 mg/dL (ref 5–40)

## 2018-12-28 DIAGNOSIS — H52223 Regular astigmatism, bilateral: Secondary | ICD-10-CM | POA: Diagnosis not present

## 2018-12-29 DIAGNOSIS — F418 Other specified anxiety disorders: Secondary | ICD-10-CM | POA: Insufficient documentation

## 2018-12-31 MED FILL — PREVIDENT 5000 SENSITIVE PA: 1.1-5 | 30 days supply | Qty: 100 | Fill #0

## 2019-01-15 ENCOUNTER — Telehealth: Payer: 59 | Admitting: Family Medicine

## 2019-01-23 ENCOUNTER — Encounter: Payer: Self-pay | Admitting: Student in an Organized Health Care Education/Training Program

## 2019-02-12 DIAGNOSIS — N4341 Spermatocele of epididymis, single: Secondary | ICD-10-CM | POA: Diagnosis not present

## 2019-02-12 DIAGNOSIS — R351 Nocturia: Secondary | ICD-10-CM | POA: Diagnosis not present

## 2019-02-12 MED FILL — TAMSULOSIN HCL 0.4 MG CAP: 0.4 | 30 days supply | Qty: 30 | Fill #0

## 2019-02-13 DIAGNOSIS — Z03818 Encounter for observation for suspected exposure to other biological agents ruled out: Secondary | ICD-10-CM | POA: Diagnosis not present

## 2019-02-15 ENCOUNTER — Other Ambulatory Visit: Payer: Self-pay

## 2019-02-15 ENCOUNTER — Ambulatory Visit (INDEPENDENT_AMBULATORY_CARE_PROVIDER_SITE_OTHER): Payer: 59 | Admitting: Cardiovascular Disease

## 2019-02-15 ENCOUNTER — Encounter: Payer: Self-pay | Admitting: Cardiovascular Disease

## 2019-02-15 VITALS — BP 110/60 | HR 62 | Ht 67.0 in | Wt 193.0 lb

## 2019-02-15 DIAGNOSIS — I340 Nonrheumatic mitral (valve) insufficiency: Secondary | ICD-10-CM

## 2019-02-15 DIAGNOSIS — E781 Pure hyperglyceridemia: Secondary | ICD-10-CM | POA: Diagnosis not present

## 2019-02-15 DIAGNOSIS — E785 Hyperlipidemia, unspecified: Secondary | ICD-10-CM | POA: Insufficient documentation

## 2019-02-15 NOTE — Progress Notes (Signed)
Cardiology Office Note:    Date:  02/15/2019   ID:  Joseph Santiago, DOB 01-05-1986, MRN [id number]  PCP:  Richarda Osmond, DO  Cardiologist:  Zo L[place]on  Electrophysiologist:  None   Referring MD: Leeanne Rio, MD   Chief Complaint  Patient presents with  . Hyperlipidemia    History of Present Illness:    Joseph Santiago is a 33 y.o. male with a hx of HTN, hyperlipidemia   We were asked to see him today by Dr. Ouida Sills for further evaluation of his hx of HTN and hyperlipiemia .    He is in the [employer] program at [employer].  Has a hx of hyperlipidemia and HTN Has had lifestyle changes since being diagnoses with HTN and HLD . His HLD has improved.  BP is now normal.      2 years ago was significant tachypalpitations and was having trouble sleeping.  He had an extensive work-up at [facility].  A limited echocardiogram revealed normal left ventricular size and function.  Ejection fraction was 60 to 65%.  He had trace mitral regurgitation, mild tricuspid regurgitation.  Right ventricular pressures were normal.  CTA of the chest revealed no evidence of pulmonary embolus.  There were no other pulmonary or cardiac concerns seen on the CT scan.  He has improved his diet and exercise / workouts.  No CP or dyspnea  Non smoker Does not drink   Only med is flomax to help prevent nocturia  Does 3  days a week of cardio  And does weight on the same days   + family hx of HTN .   Past Medical History:  Diagnosis Date  . Hypertension     History reviewed. No pertinent surgical history.  Current Medications: No outpatient medications have been marked as taking for the 02/15/19 encounter (Office Visit) with Shanetra Blumenstock, Wonda Cheng, MD.     Allergies:   Bactrim [sulfamethoxazole-trimethoprim]   Social History   Socioeconomic History  . Marital status: Single    Spouse name: Not on file  . Number of children: Not on file  . Years of education: Not on file  . Highest  education level: Not on file  Occupational History  . Not on file  Tobacco Use  . Smoking status: Never Smoker  . Smokeless tobacco: Never Used  Substance and Sexual Activity  . Alcohol use: No  . Drug use: No  . Sexual activity: Not on file  Other Topics Concern  . Not on file  Social History Narrative  . Not on file   Social Determinants of Health   Financial Resource Strain:   . Difficulty of Paying Living Expenses: Not on file  Food Insecurity:   . Worried About Charity fundraiser in the Last Year: Not on file  . Ran Out of Food in the Last Year: Not on file  Transportation Needs:   . Lack of Transportation (Medical): Not on file  . Lack of Transportation (Non-Medical): Not on file  Physical Activity:   . Days of Exercise per Week: Not on file  . Minutes of Exercise per Session: Not on file  Stress:   . Feeling of Stress : Not on file  Social Connections:   . Frequency of Communication with Friends and Family: Not on file  . Frequency of Social Gatherings with Friends and Family: Not on file  . Attends Religious Services: Not on file  . Active Member of Clubs or Organizations: Not on  file  . Attends Archivist Meetings: Not on file  . Marital Status: Not on file     Family History: The patient's family history includes Hypertension in his father and mother.  ROS:   Please see the history of present illness.     All other systems reviewed and are negative.  EKGs/Labs/Other Studies Reviewed:      EKG:   Dec. 18, 2020:   NSR at 62.  Normal ECG   Recent Labs: No results found for requested labs within last 8760 hours.  Recent Lipid Panel    Component Value Date/Time   CHOL 172 12/26/2018 1710   TRIG 174 (H) 12/26/2018 1710   HDL 45 12/26/2018 1710   CHOLHDL 3.8 12/26/2018 1710   LDLCALC 97 12/26/2018 1710    Physical Exam:    VS:  BP 110/60   Pulse 62   Ht 5\' 7"  (1.702 m)   Wt 193 lb (87.5 kg)   BMI 30.23 kg/m     Wt Readings from  Last 3 Encounters:  02/15/19 193 lb (87.5 kg)  12/26/18 190 lb (86.2 kg)  11/27/18 190 lb (86.2 kg)     GEN:  Well nourished, well developed in no acute distress HEENT: Normal NECK: No JVD; No carotid bruits LYMPHATICS: No lymphadenopathy CARDIAC: RRR, no murmurs, rubs, gallops RESPIRATORY:  Clear to auscultation without rales, wheezing or rhonchi  ABDOMEN: Soft, non-tender, non-distended MUSCULOSKELETAL:  No edema; No deformity  SKIN: Warm and dry NEUROLOGIC:  Alert and oriented x 3 PSYCHIATRIC:  Normal affect   ASSESSMENT:    1. Hypertriglyceridemia   2. Mitral valve insufficiency, unspecified etiology    PLAN:    In order of problems listed above:  1. History of hypertension: He had hypertension several years ago but this was when his diet was very high in salt and he was not exercising quite as much.  He is greatly improved his lifestyles and is eating better and is exercising more his blood pressure is now normal.  2.  Hypertriglyceridemia.  He has had somehow high cholesterol levels in the past.  His triglyceride levels are now slightly elevated.  In reviewing his diet, he eats lots of bread, pasta, rice, potatoes.  I have given him information on the Mediterranean diet.  He should continue to exercise but he will need to cut out his starches.  I will see him again in 1 year for follow-up visit.  We will plan on checking lipids, liver enzymes, basic metabolic profile the week prior to his visit.   Medication Adjustments/Labs and Tests Ordered: Current medicines are reviewed at length with the patient today.  Concerns regarding medicines are outlined above.  Orders Placed This Encounter  Procedures  . EKG 12-Lead   No orders of the defined types were placed in this encounter.   Patient Instructions  Medication Instructions:  Your physician does not recommend any new or different medications   Lab Work: None Ordered   Testing/Procedures: None  Ordered   Follow-Up: At [facility], you and your health needs are our priority.  As part of our continuing mission to provide you with exceptional heart care, we have created designated Provider Care Teams.  These Care Teams include your primary Cardiologist (physician) and Advanced Practice Providers (APPs -  Physician Assistants and Nurse Practitioners) who all work together to provide you with the care you need, when you need it.  Your next appointment:   1 year(s)  The format for  your next appointment:   In Person  Provider:   You may see Dr. Acie Fredrickson or one of the following Advanced Practice Providers on your designated Care Team:    Richardson Dopp, PA-C  Vin [place], [place]  Daune Perch, NP   Other Instructions  [place] refers to food and lifestyle choices that are based on the traditions of countries located on the [employer]. This way of eating has been shown to help prevent certain conditions and improve outcomes for people who have chronic diseases, like kidney disease and heart disease. What are tips for following this plan? Lifestyle  Cook and eat meals together with your family, when possible.  Drink enough fluid to keep your urine clear or pale yellow.  Be physically active every day. This includes: ? Aerobic exercise like running or swimming. ? Leisure activities like gardening, walking, or housework.  Get 7-8 hours of sleep each night.  If recommended by your health care provider, drink red wine in moderation. This means 1 glass a day for nonpregnant women and 2 glasses a day for men. A glass of wine equals 5 oz (150 mL). Reading food labels   Check the serving size of packaged foods. For foods such as rice and pasta, the serving size refers to the amount of cooked product, not dry.  Check the total fat in packaged foods. Avoid foods that have saturated fat or trans fats.  Check the ingredients list for added  sugars, such as corn syrup. Shopping  At the grocery store, buy most of your food from the areas near the walls of the store. This includes: ? Fresh fruits and vegetables (produce). ? Grains, beans, nuts, and seeds. Some of these may be available in unpackaged forms or large amounts (in bulk). ? Fresh seafood. ? Poultry and eggs. ? Low-fat dairy products.  Buy whole ingredients instead of prepackaged foods.  Buy fresh fruits and vegetables in-season from local farmers markets.  Buy frozen fruits and vegetables in resealable bags.  If you do not have access to quality fresh seafood, buy precooked frozen shrimp or canned fish, such as tuna, salmon, or sardines.  Buy small amounts of raw or cooked vegetables, salads, or olives from the deli or salad bar at your store.  Stock your pantry so you always have certain foods on hand, such as olive oil, canned tuna, canned tomatoes, rice, pasta, and beans. Cooking  Cook foods with extra-virgin olive oil instead of using butter or other vegetable oils.  Have meat as a side dish, and have vegetables or grains as your main dish. This means having meat in small portions or adding small amounts of meat to foods like pasta or stew.  Use beans or vegetables instead of meat in common dishes like chili or lasagna.  Experiment with different cooking methods. Try roasting or broiling vegetables instead of steaming or sauteing them.  Add frozen vegetables to soups, stews, pasta, or rice.  Add nuts or seeds for added healthy fat at each meal. You can add these to yogurt, salads, or vegetable dishes.  Marinate fish or vegetables using olive oil, lemon juice, garlic, and fresh herbs. Meal planning   Plan to eat 1 vegetarian meal one day each week. Try to work up to 2 vegetarian meals, if possible.  Eat seafood 2 or more times a week.  Have healthy snacks readily available, such as: ? Vegetable sticks with hummus. ? [place] yogurt. ? Fruit and nut  trail  mix.  Eat balanced meals throughout the week. This includes: ? Fruit: 2-3 servings a day ? Vegetables: 4-5 servings a day ? Low-fat dairy: 2 servings a day ? Fish, poultry, or lean meat: 1 serving a day ? Beans and legumes: 2 or more servings a week ? Nuts and seeds: 1-2 servings a day ? Whole grains: 6-8 servings a day ? Extra-virgin olive oil: 3-4 servings a day  Limit red meat and sweets to only a few servings a month What are my food choices?  Mediterranean diet ? Recommended  Grains: Whole-grain pasta. Brown rice. Bulgar wheat. Polenta. Couscous. Whole-wheat bread. [place].  Vegetables: Artichokes. Beets. Broccoli. Cabbage. Carrots. Eggplant. Green beans. Chard. Kale. Spinach. Onions. Leeks. Peas. Squash. Tomatoes. Peppers. Radishes.  Fruits: Apples. Apricots. Avocado. Berries. Bananas. Cherries. Dates. Figs. Grapes. Lemons. Melon. Oranges. Peaches. Plums. Pomegranate.  Meats and other protein foods: Beans. Almonds. Sunflower seeds. Pine nuts. Peanuts. [place]. Salmon. Scallops. Shrimp. [place]. Tilapia. Clams. Oysters. Eggs.  Dairy: Low-fat milk. Cheese. Greek yogurt.  Beverages: Water. Red wine. Herbal tea.  Fats and oils: Extra virgin olive oil. Avocado oil. Grape seed oil.  Sweets and desserts: [place] yogurt with honey. Baked apples. Poached pears. Trail mix.  Seasoning and other foods: Basil. Cilantro. Coriander. Cumin. Mint. Parsley. Sage. Rosemary. Tarragon. Garlic. Oregano. Thyme. Pepper. Balsalmic vinegar. Tahini. Hummus. Tomato sauce. Olives. Mushrooms. ? Limit these  Grains: Prepackaged pasta or rice dishes. Prepackaged cereal with added sugar.  Vegetables: Deep fried potatoes (french fries).  Fruits: Fruit canned in syrup.  Meats and other protein foods: Beef. Pork. Lamb. Poultry with skin. Hot dogs. Berniece Salines.  Dairy: Ice cream. Sour cream. Whole milk.  Beverages: Juice. Sugar-sweetened soft drinks. Beer. Liquor and spirits.  Fats and oils: Butter.  Canola oil. Vegetable oil. Beef fat (tallow). Lard.  Sweets and desserts: Cookies. Cakes. Pies. Candy.  Seasoning and other foods: Mayonnaise. Premade sauces and marinades. The items listed may not be a complete list. Talk with your dietitian about what dietary choices are right for you. Summary  The Mediterranean diet includes both food and lifestyle choices.  Eat a variety of fresh fruits and vegetables, beans, nuts, seeds, and whole grains.  Limit the amount of red meat and sweets that you eat.  Talk with your health care provider about whether it is safe for you to drink red wine in moderation. This means 1 glass a day for nonpregnant women and 2 glasses a day for men. A glass of wine equals 5 oz (150 mL). This information is not intended to replace advice given to you by your health care provider. Make sure you discuss any questions you have with your health care provider. Document Released: 10/08/2015 Document Revised: 10/15/2015 Document Reviewed: 10/08/2015 Elsevier Patient Education  2020 [employer], Mertie Moores, MD  02/15/2019 3:02 PM    [facility]

## 2019-02-15 NOTE — Patient Instructions (Signed)
Medication Instructions:  Your physician does not recommend any new or different medications   Lab Work: None Ordered   Testing/Procedures: None Ordered   Follow-Up: At [facility], you and your health needs are our priority.  As part of our continuing mission to provide you with exceptional heart care, we have created designated Provider Care Teams.  These Care Teams include your primary Cardiologist (physician) and Advanced Practice Providers (APPs -  Physician Assistants and Nurse Practitioners) who all work together to provide you with the care you need, when you need it.  Your next appointment:   1 year(s)  The format for your next appointment:   In Person  Provider:   You may see Dr. Acie Fredrickson or one of the following Advanced Practice Providers on your designated Care Team:    Richardson Dopp, PA-C  Vin [place], [place]  Daune Perch, NP   Other Instructions  [place] refers to food and lifestyle choices that are based on the traditions of countries located on the [employer]. This way of eating has been shown to help prevent certain conditions and improve outcomes for people who have chronic diseases, like kidney disease and heart disease. What are tips for following this plan? Lifestyle  Cook and eat meals together with your family, when possible.  Drink enough fluid to keep your urine clear or pale yellow.  Be physically active every day. This includes: ? Aerobic exercise like running or swimming. ? Leisure activities like gardening, walking, or housework.  Get 7-8 hours of sleep each night.  If recommended by your health care provider, drink red wine in moderation. This means 1 glass a day for nonpregnant women and 2 glasses a day for men. A glass of wine equals 5 oz (150 mL). Reading food labels   Check the serving size of packaged foods. For foods such as rice and pasta, the serving size refers to the amount of cooked  product, not dry.  Check the total fat in packaged foods. Avoid foods that have saturated fat or trans fats.  Check the ingredients list for added sugars, such as corn syrup. Shopping  At the grocery store, buy most of your food from the areas near the walls of the store. This includes: ? Fresh fruits and vegetables (produce). ? Grains, beans, nuts, and seeds. Some of these may be available in unpackaged forms or large amounts (in bulk). ? Fresh seafood. ? Poultry and eggs. ? Low-fat dairy products.  Buy whole ingredients instead of prepackaged foods.  Buy fresh fruits and vegetables in-season from local farmers markets.  Buy frozen fruits and vegetables in resealable bags.  If you do not have access to quality fresh seafood, buy precooked frozen shrimp or canned fish, such as tuna, salmon, or sardines.  Buy small amounts of raw or cooked vegetables, salads, or olives from the deli or salad bar at your store.  Stock your pantry so you always have certain foods on hand, such as olive oil, canned tuna, canned tomatoes, rice, pasta, and beans. Cooking  Cook foods with extra-virgin olive oil instead of using butter or other vegetable oils.  Have meat as a side dish, and have vegetables or grains as your main dish. This means having meat in small portions or adding small amounts of meat to foods like pasta or stew.  Use beans or vegetables instead of meat in common dishes like chili or lasagna.  Experiment with different cooking methods. Try roasting or broiling  vegetables instead of steaming or sauteing them.  Add frozen vegetables to soups, stews, pasta, or rice.  Add nuts or seeds for added healthy fat at each meal. You can add these to yogurt, salads, or vegetable dishes.  Marinate fish or vegetables using olive oil, lemon juice, garlic, and fresh herbs. Meal planning   Plan to eat 1 vegetarian meal one day each week. Try to work up to 2 vegetarian meals, if  possible.  Eat seafood 2 or more times a week.  Have healthy snacks readily available, such as: ? Vegetable sticks with hummus. ? [place] yogurt. ? Fruit and nut trail mix.  Eat balanced meals throughout the week. This includes: ? Fruit: 2-3 servings a day ? Vegetables: 4-5 servings a day ? Low-fat dairy: 2 servings a day ? Fish, poultry, or lean meat: 1 serving a day ? Beans and legumes: 2 or more servings a week ? Nuts and seeds: 1-2 servings a day ? Whole grains: 6-8 servings a day ? Extra-virgin olive oil: 3-4 servings a day  Limit red meat and sweets to only a few servings a month What are my food choices?  Mediterranean diet ? Recommended  Grains: Whole-grain pasta. Brown rice. Bulgar wheat. Polenta. Couscous. Whole-wheat bread. [place].  Vegetables: Artichokes. Beets. Broccoli. Cabbage. Carrots. Eggplant. Green beans. Chard. Kale. Spinach. Onions. Leeks. Peas. Squash. Tomatoes. Peppers. Radishes.  Fruits: Apples. Apricots. Avocado. Berries. Bananas. Cherries. Dates. Figs. Grapes. Lemons. Melon. Oranges. Peaches. Plums. Pomegranate.  Meats and other protein foods: Beans. Almonds. Sunflower seeds. Pine nuts. Peanuts. [place]. Salmon. Scallops. Shrimp. [place]. Tilapia. Clams. Oysters. Eggs.  Dairy: Low-fat milk. Cheese. Greek yogurt.  Beverages: Water. Red wine. Herbal tea.  Fats and oils: Extra virgin olive oil. Avocado oil. Grape seed oil.  Sweets and desserts: [place] yogurt with honey. Baked apples. Poached pears. Trail mix.  Seasoning and other foods: Basil. Cilantro. Coriander. Cumin. Mint. Parsley. Sage. Rosemary. Tarragon. Garlic. Oregano. Thyme. Pepper. Balsalmic vinegar. Tahini. Hummus. Tomato sauce. Olives. Mushrooms. ? Limit these  Grains: Prepackaged pasta or rice dishes. Prepackaged cereal with added sugar.  Vegetables: Deep fried potatoes (french fries).  Fruits: Fruit canned in syrup.  Meats and other protein foods: Beef. Pork. Lamb. Poultry with  skin. Hot dogs. Berniece Salines.  Dairy: Ice cream. Sour cream. Whole milk.  Beverages: Juice. Sugar-sweetened soft drinks. Beer. Liquor and spirits.  Fats and oils: Butter. Canola oil. Vegetable oil. Beef fat (tallow). Lard.  Sweets and desserts: Cookies. Cakes. Pies. Candy.  Seasoning and other foods: Mayonnaise. Premade sauces and marinades. The items listed may not be a complete list. Talk with your dietitian about what dietary choices are right for you. Summary  The Mediterranean diet includes both food and lifestyle choices.  Eat a variety of fresh fruits and vegetables, beans, nuts, seeds, and whole grains.  Limit the amount of red meat and sweets that you eat.  Talk with your health care provider about whether it is safe for you to drink red wine in moderation. This means 1 glass a day for nonpregnant women and 2 glasses a day for men. A glass of wine equals 5 oz (150 mL). This information is not intended to replace advice given to you by your health care provider. Make sure you discuss any questions you have with your health care provider. Document Released: 10/08/2015 Document Revised: 10/15/2015 Document Reviewed: 10/08/2015 Elsevier Patient Education  2020 [employer].

## 2019-02-19 DIAGNOSIS — Z03818 Encounter for observation for suspected exposure to other biological agents ruled out: Secondary | ICD-10-CM | POA: Diagnosis not present

## 2019-03-09 ENCOUNTER — Other Ambulatory Visit: Payer: Self-pay

## 2019-03-09 ENCOUNTER — Ambulatory Visit (HOSPITAL_COMMUNITY)
Admission: EM | Admit: 2019-03-09 | Discharge: 2019-03-09 | Disposition: A | Discharge status: Home / Self Care | Payer: 59 | Attending: Emergency Medicine | Admitting: Emergency Medicine

## 2019-03-09 ENCOUNTER — Encounter (HOSPITAL_COMMUNITY): Payer: Self-pay

## 2019-03-09 DIAGNOSIS — J029 Acute pharyngitis, unspecified: Secondary | ICD-10-CM | POA: Insufficient documentation

## 2019-03-09 DIAGNOSIS — Z20822 Contact with and (suspected) exposure to covid-19: Secondary | ICD-10-CM | POA: Diagnosis not present

## 2019-03-09 LAB — POCT INFECTIOUS MONO SCREEN: Mono Screen: NEGATIVE

## 2019-03-09 LAB — POCT RAPID STREP A: Streptococcus, Group A Screen (Direct): NEGATIVE

## 2019-03-09 MED ORDER — IBUPROFEN 600 MG PO TABS: 600.0000 mg | ORAL_TABLET | Freq: Four times a day (QID) | ORAL | 0 refills | Status: DC | PRN

## 2019-03-09 NOTE — ED Provider Notes (Signed)
HPI  SUBJECTIVE:  Patient reports sore throat starting 1 week ago. Sx worse with talking.  Sx better with salt water gargles. Has been taking Halls, Tylenol, ibuprofen, hot teas w/ o relief.  No fever   No neck stiffness  No Cough + Mild nasal congestion, no rhinorrhea + Myalgias + Headache No Rash + Fatigue  No loss of taste or smell No shortness of breath or difficulty breathing No nausea, vomiting No diarrhea No abdominal pain     No Recent Strep, mono, COVID exposure No reflux sxs No Allergy sxs  No Breathing difficulty, voice changes, sensation of throat swelling shut No Drooling No Trismus No antipyretic in past 4-6 hrs   Patient has a past medical history of diet/exercise controlled hypertension.  States that he gets frequent sore throats, about 6 times per year.  This is usually treated with antibiotics, but has never been told that it was strep.  No history of seasonal allergies, GERD, asthma, smoking, coronary disease, diabetes, chronic kidney disease, cancer, HIV, immunocompromise.  [id number], Chelsey L, DO    Past Medical History:  Diagnosis Date  . Hypertension     History reviewed. No pertinent surgical history.  Family History  Problem Relation Age of Onset  . Hypertension Mother   . Hypertension Father     Social History   Tobacco Use  . Smoking status: Never Smoker  . Smokeless tobacco: Never Used  Substance Use Topics  . Alcohol use: No  . Drug use: No    No current facility-administered medications for this encounter.  Current Outpatient Medications:  .  Tamsulosin HCl (FLOMAX PO), Take by mouth., Disp: , Rfl:  .  ibuprofen (ADVIL) 600 MG tablet, Take 1 tablet (600 mg total) by mouth every 6 (six) hours as needed., Disp: 30 tablet, Rfl: 0  Allergies  Allergen Reactions  . Bactrim [Sulfamethoxazole-Trimethoprim] Hives and Swelling    Throat closes up     ROS  As noted in HPI.   Physical Exam  BP 127/60 (BP Location:  Right Arm)   Pulse 62   Temp 98.3 F (36.8 C) (Oral)   Resp 16   SpO2 98%   Constitutional: Well developed, well nourished, no acute distress Eyes:  EOMI, conjunctiva normal bilaterally HENT: Normocephalic, atraumatic,mucus membranes moist. - nasal congestion +erythematous oropharynx - enlarged tonsils  - exudates. Uvula midline.  Respiratory: Normal inspiratory effort Cardiovascular: Normal rate, no murmurs, rubs, gallops GI: nondistended, nontender. No appreciable splenomegaly skin: No rash, skin intact Lymph: + Anterior cervical LN.  No posterior cervical lymphadenopathy Musculoskeletal: no deformities Neurologic: Alert & oriented x 3, no focal neuro deficits Psychiatric: Speech and behavior appropriate.   ED Course   Medications - No data to display  Orders Placed This Encounter  Procedures  . Novel Coronavirus, NAA (Hosp order, Send-out to Ref Lab; TAT 18-24 hrs    Standing Status:   Standing    Number of Occurrences:   1    Order Specific Question:   Is this test for diagnosis or screening    Answer:   Screening    Order Specific Question:   Symptomatic for COVID-19 as defined by CDC    Answer:   No    Order Specific Question:   Hospitalized for COVID-19    Answer:   No    Order Specific Question:   Admitted to ICU for COVID-19    Answer:   No    Order Specific Question:   Previously tested for  COVID-19    Answer:   Yes    Order Specific Question:   Resident in a congregate (group) care setting    Answer:   No    Order Specific Question:   Employed in healthcare setting    Answer:   No  . Culture, group A strep    Standing Status:   Standing    Number of Occurrences:   1  . POCT rapid strep A [facility])    Standing Status:   Standing    Number of Occurrences:   1  . POC Infectious mono screen    Standing Status:   Standing    Number of Occurrences:   1  . Infectious mono screen, POC    Standing Status:   Standing    Number of Occurrences:   1     Results for orders placed or performed during the hospital encounter of 03/09/19 (from the past 24 hour(s))  POCT rapid strep A [facility])     Status: None   Collection Time: 03/09/19  6:14 PM  Result Value Ref Range   Streptococcus, Group A Screen (Direct) NEGATIVE NEGATIVE  Infectious mono screen, POC     Status: None   Collection Time: 03/09/19  6:53 PM  Result Value Ref Range   Mono Screen NEGATIVE NEGATIVE   No results found.  ED Clinical Impression  1. Sore throat   2. Encounter for laboratory testing for COVID-19 virus      ED Assessment/Plan   Rapid strep negative. Obtaining throat culture to guide antibiotic treatment.  Mono negative.  Covid PCR pending.  Discussed this with patient. We'll contact him if culture is positive, and will call in Appropriate antibiotics. Patient home with ibuprofen, Tylenol, Benadryl/Maalox mixture.  May try Pepcid if all of his testing is negative and his sore throat persists.. Patient to followup with PMD when necessary, to the ER if he gets worse   Discussed labs,  MDM, plan and followup with patient. Discussed sn/sx that should prompt return to the ED. patient agrees with plan.   Meds ordered this encounter  Medications  . ibuprofen (ADVIL) 600 MG tablet    Sig: Take 1 tablet (600 mg total) by mouth every 6 (six) hours as needed.    Dispense:  30 tablet    Refill:  0     *This clinic note was created using [employer]. Therefore, there may be occasional mistakes despite careful proofreading.     Melynda Ripple, MD 03/09/19 [phone]

## 2019-03-09 NOTE — ED Triage Notes (Signed)
Pt presents to UC with sore throat and headache x 1 week.

## 2019-03-09 NOTE — Discharge Instructions (Addendum)
Your mono was negative.  Your rapid strep was negative today, so we have sent off a throat culture.  We will contact you and call in the appropriate antibiotics if your culture comes back positive for an infection requiring antibiotic treatment.  Give [place] a working phone number.  Your Covid PCR test will be back in about 1 to 2 days.  1 gram of Tylenol and 600 mg ibuprofen together 3-4 times a day as needed for pain.  Make sure you drink plenty of extra fluids.  Some people find salt water gargles and  Traditional Medicinal's "Throat Coat" tea helpful. Take 5 mL of liquid Benadryl and 5 mL of Maalox. Mix it together, and then hold it in your mouth for as long as you can and then swallow. You may do this 4 times a day.    If all your testing is negative and you still have a sore throat, I would suggest trying some Pepcid.  This could be from GERD.  Go to www.goodrx.com to look up your medications. This will give you a list of where you can find your prescriptions at the most affordable prices. Or ask the pharmacist what the cash price is, or if they have any other discount programs available to help make your medication more affordable. This can be less expensive than what you would pay with insurance.

## 2019-03-10 LAB — NOVEL CORONAVIRUS, NAA (HOSP ORDER, SEND-OUT TO REF LAB; TAT 18-24 HRS): SARS-CoV-2, NAA: NOT DETECTED

## 2019-03-11 LAB — CULTURE, GROUP A STREP (THRC)

## 2019-03-15 DIAGNOSIS — Z03818 Encounter for observation for suspected exposure to other biological agents ruled out: Secondary | ICD-10-CM | POA: Diagnosis not present

## 2019-03-26 DIAGNOSIS — R351 Nocturia: Secondary | ICD-10-CM | POA: Diagnosis not present

## 2019-05-23 MED FILL — MYRBETRIQ ER 50 MG TABLET: 50 | 30 days supply | Qty: 30 | Fill #0

## 2019-06-25 ENCOUNTER — Telehealth: Payer: Self-pay

## 2019-06-25 NOTE — Telephone Encounter (Signed)
Patient calls nurse stating he was seen for a new patient visit on 11/27/2019. Patient stated he thought this was considered a physical, and was under the impression it was. 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   If this can be done, the visit needs to be faxed to " [facility]," so they can note the changes.   Will forward to PCP and Janett Billow.

## 2019-06-28 NOTE — Telephone Encounter (Signed)
Hi, I'm just checking in to see if this has been addressed? I don't think I can open and alter the visit since it's been cosigned by an attending.

## 2019-07-03 ENCOUNTER — Ambulatory Visit (HOSPITAL_COMMUNITY)
Admission: EM | Admit: 2019-07-03 | Discharge: 2019-07-03 | Disposition: A | Discharge status: Home / Self Care | Payer: 59 | Attending: Family Medicine | Admitting: Family Medicine

## 2019-07-03 ENCOUNTER — Other Ambulatory Visit: Payer: Self-pay

## 2019-07-03 ENCOUNTER — Encounter (HOSPITAL_COMMUNITY): Payer: Self-pay

## 2019-07-03 DIAGNOSIS — R21 Rash and other nonspecific skin eruption: Secondary | ICD-10-CM | POA: Diagnosis not present

## 2019-07-03 MED ORDER — PREDNISONE 20 MG PO TABS: 20.0000 mg | ORAL_TABLET | Freq: Every day | ORAL | 0 refills | Status: DC

## 2019-07-03 MED ORDER — TRIAMCINOLONE ACETONIDE 0.1 % EX CREA: 1.0000 "application " | TOPICAL_CREAM | Freq: Two times a day (BID) | CUTANEOUS | 0 refills | Status: DC

## 2019-07-03 MED ORDER — PREDNISONE 20 MG PO TABS: 20.0000 mg | ORAL_TABLET | Freq: Every day | ORAL | 0 refills | Status: AC

## 2019-07-03 NOTE — ED Provider Notes (Signed)
Joseph Santiago    CSN: [id number] Arrival date & time: 07/03/19  1904      History   Chief Complaint Chief Complaint  Patient presents with  . Rash    HPI Joseph Santiago is a 34 y.o. male history of hypertension, presenting today for evaluation of a rash.  Patient notes that he woke up this morning feeling itchy to his chest and arms, this evening he noticed a rash to his arms prompting evaluation today.  He has also had similar rash to bilateral inner thighs.  He denies any difficulty breathing or shortness of breath.  He denies history of similar.  He denies any new soaps, lotions or detergents.  Denies any new foods or medicines.  Cannot recall any specific possible trigger.  Has not taken any medicines for his symptoms.  HPI  Past Medical History:  Diagnosis Date  . Hypertension     Patient Active Problem List   Diagnosis Date Noted  . Hyperlipidemia 02/15/2019  . Anxiety about health 12/29/2018  . Epidermoid cyst 12/26/2018  . Hyperlipemia 11/30/2018  . Epididymal cyst 11/28/2018  . Nocturnal polyuria 11/28/2018    History reviewed. No pertinent surgical history.     Home Medications    Prior to Admission medications   Medication Sig Start Date End Date Taking? Authorizing Provider  ibuprofen (ADVIL) 600 MG tablet Take 1 tablet (600 mg total) by mouth every 6 (six) hours as needed. 03/09/19   Melynda Ripple, MD  predniSONE (DELTASONE) 20 MG tablet Take 1 tablet (20 mg total) by mouth daily with breakfast for 7 days. 07/03/19 07/10/19  Camie Hauss C, PA-C  Tamsulosin HCl (FLOMAX PO) Take by mouth.    [provider]  triamcinolone cream (KENALOG) 0.1 % Apply 1 application topically 2 (two) times daily. 07/03/19   Jolan Upchurch, Elesa Hacker, PA-C    Family History Family History  Problem Relation Age of Onset  . Hypertension Mother   . Hypertension Father     Social History Social History   Tobacco Use  . Smoking status: Never Smoker  .  Smokeless tobacco: Never Used  Substance Use Topics  . Alcohol use: No  . Drug use: No     Allergies   Bactrim [sulfamethoxazole-trimethoprim]   Review of Systems Review of Systems  Constitutional: Negative for fatigue and fever.  Eyes: Negative for redness, itching and visual disturbance.  Respiratory: Negative for shortness of breath.   Cardiovascular: Negative for chest pain and leg swelling.  Gastrointestinal: Negative for nausea and vomiting.  Musculoskeletal: Negative for arthralgias and myalgias.  Skin: Positive for color change and rash. Negative for wound.  Neurological: Negative for dizziness, syncope, weakness, light-headedness and headaches.     Physical Exam Triage Vital Signs ED Triage Vitals  Enc Vitals Group     BP 07/03/19 1931 126/84     Pulse Rate 07/03/19 1931 70     Resp 07/03/19 1931 20     Temp 07/03/19 1931 98.7 F (37.1 C)     Temp Source 07/03/19 1931 Oral     SpO2 07/03/19 1931 97 %     Weight --      Height --      Head Circumference --      Peak Flow --      Pain Score 07/03/19 1930 0     Pain Loc --      Pain Edu? --      Excl. in [place]? --  No data found.  Updated Vital Signs BP 126/84 (BP Location: Right Arm)   Pulse 70   Temp 98.7 F (37.1 C) (Oral)   Resp 20   SpO2 97%   Visual Acuity Right Eye Distance:   Left Eye Distance:   Bilateral Distance:    Right Eye Near:   Left Eye Near:    Bilateral Near:     Physical Exam Vitals and nursing note reviewed.  Constitutional:      Appearance: He is well-developed.     Comments: No acute distress  HENT:     Head: Normocephalic and atraumatic.     Nose: Nose normal.     Mouth/Throat:     Comments: Oral mucosa pink and moist, no tonsillar enlargement or exudate. Posterior pharynx patent and nonerythematous, no uvula deviation or swelling. Normal phonation.  No lesions on oral mucosa Eyes:     Conjunctiva/sclera: Conjunctivae normal.  Cardiovascular:     Rate and  Rhythm: Normal rate.  Pulmonary:     Effort: Pulmonary effort is normal. No respiratory distress.     Comments: Breathing comfortably at rest, CTABL, no wheezing, rales or other adventitious sounds auscultated Abdominal:     General: There is no distension.  Musculoskeletal:        General: Normal range of motion.     Cervical back: Neck supple.  Skin:    General: Skin is warm and dry.     Comments: Bilateral anterior upper arms with erythematous slightly raised macular papular rash, similar rash to bilateral proximal inner thighs, less prominent but still present on upper back and behind left ear  Neurological:     Mental Status: He is alert and oriented to person, place, and time.      UC Treatments / Results  Labs (all labs ordered are listed, but only abnormal results are displayed) Labs Reviewed - No data to display  EKG   Radiology No results found.  Procedures Procedures (including critical care time)  Medications Ordered in UC Medications - No data to display  Initial Impression / Assessment and Plan / UC Course  I have reviewed the triage vital signs and the nursing notes.  Pertinent labs & imaging results that were available during my care of the patient were reviewed by me and considered in my medical decision making (see chart for details).     Rash highly suggestive of likely allergic reaction versus contact dermatitis.  Recommending course of prednisone, providing 20 mg daily, also recommended antihistamines to further help with itching/reaction.  Monitor for gradual improvement over the next week.  No airway involvement at this time.  Unclear trigger.  Discussed strict return precautions. Patient verbalized understanding and is agreeable with plan.  Final Clinical Impressions(s) / UC Diagnoses   Final diagnoses:  Rash and nonspecific skin eruption     Discharge Instructions     This rash appears to be an allergic reaction likely to something you  were exposed to- should gradually resolve over the next week Begin taking daily antihistamine and start Zyrtec or Claritin in the morning, may supplement with Benadryl at nighttime Begin prednisone daily for the next week, if rash fully resolves may stop early May use triamcinolone cream to rash for areas of significant itching  Please monitor for this rash to gradually resolve, please follow-up if any symptoms not improving or worsening, spreading, developing any difficulty breathing or shortness of breath   ED Prescriptions    Medication Sig Dispense Auth. Provider  predniSONE (DELTASONE) 20 MG tablet  (Status: Discontinued) Take 1 tablet (20 mg total) by mouth daily with breakfast for 7 days. 7 tablet Adrien Dietzman C, PA-C   triamcinolone cream (KENALOG) 0.1 %  (Status: Discontinued) Apply 1 application topically 2 (two) times daily. 45 g Shirleen Mcfaul C, PA-C   predniSONE (DELTASONE) 20 MG tablet Take 1 tablet (20 mg total) by mouth daily with breakfast for 7 days. 7 tablet Cylinda Santoli C, PA-C   triamcinolone cream (KENALOG) 0.1 % Apply 1 application topically 2 (two) times daily. 45 g Tamir Wallman, [place] C, PA-C     PDMP not reviewed this encounter.   Janith Lima, [place] 07/03/19 2041

## 2019-07-03 NOTE — ED Triage Notes (Signed)
Pt presents with rash on both arms and and chest since waking up this morning.

## 2019-07-03 NOTE — Discharge Instructions (Signed)
This rash appears to be an allergic reaction likely to something you were exposed to- should gradually resolve over the next week Begin taking daily antihistamine and start Zyrtec or Claritin in the morning, may supplement with Benadryl at nighttime Begin prednisone daily for the next week, if rash fully resolves may stop early May use triamcinolone cream to rash for areas of significant itching  Please monitor for this rash to gradually resolve, please follow-up if any symptoms not improving or worsening, spreading, developing any difficulty breathing or shortness of breath

## 2019-07-04 NOTE — Telephone Encounter (Signed)
Spoke with patient.  Explained that we did not perform a physical that day, so they charges could not be changed.  Advised he still has until July 1st to get that physical.  He reports having a physical before he started at [employer] and wants to know if that will count.  Advised that he would need to call [facility] to ask that question.  He will call back if he needs that appt. Christen Bame, CMA

## 2019-07-15 ENCOUNTER — Encounter: Payer: Self-pay | Admitting: Student in an Organized Health Care Education/Training Program

## 2019-07-18 ENCOUNTER — Other Ambulatory Visit (HOSPITAL_COMMUNITY)
Admission: RE | Admit: 2019-07-18 | Discharge: 2019-07-18 | Disposition: A | Discharge status: Home / Self Care | Payer: 59 | Source: Ambulatory Visit | Attending: Family Medicine | Admitting: Family Medicine

## 2019-07-18 ENCOUNTER — Encounter: Payer: Self-pay | Admitting: Family Medicine

## 2019-07-18 ENCOUNTER — Other Ambulatory Visit: Payer: Self-pay

## 2019-07-18 ENCOUNTER — Ambulatory Visit: Payer: 59 | Admitting: Family Medicine

## 2019-07-18 ENCOUNTER — Telehealth: Payer: Self-pay | Admitting: *Deleted

## 2019-07-18 VITALS — BP 102/68 | HR 73 | Ht 67.0 in | Wt 191.8 lb

## 2019-07-18 DIAGNOSIS — Z113 Encounter for screening for infections with a predominantly sexual mode of transmission: Secondary | ICD-10-CM

## 2019-07-18 DIAGNOSIS — Z0000 Encounter for general adult medical examination without abnormal findings: Secondary | ICD-10-CM

## 2019-07-18 NOTE — Progress Notes (Signed)
    SUBJECTIVE:   CHIEF COMPLAINT / HPI: Annual physical  Joseph Santiago is a healthy 34 year old male presenting for his annual physical.   -Exercise: 5x week  -Diet: well balanced, recently met with dietician to better incorporate nutrient rich foods  -Supportive environment: yes  -Occupation: Furniture conservator/restorer at [employer]  -Interested in STD screening: yes, however practices safe sexual practices, in monogmous relationship with girlfriend  -Health maintenance screening: Due for HIV screening -Non-smoker, no drug use   Family history of HTN, otherwise no FH of early death or colon/prostate cancers.     Office Visit from 07/18/2019 in [facility]  PHQ-2 Total Score  0      PERTINENT  PMH / PSH: History of hyperlipidemia, urinary frequency (improved, recently seen by Urology)   OBJECTIVE:   BP 102/68   Pulse 73   Ht '5\' 7"'$  (1.702 m)   Wt 191 lb 12.8 oz (87 kg)   SpO2 97%   BMI 30.04 kg/m   General: Alert, NAD HEENT: NCAT, MMM, non-tender thyroid without enlargement or nodules  Cardiac: RRR no m/g/r Lungs: Clear bilaterally, no increased WOB  Abdomen: soft, non-tender Msk: Moves all extremities spontaneously  Ext: Warm, dry  ASSESSMENT/PLAN:   Annual physical exam Reviewed past medical, surgical, and social history.  Medications updated in epic.  Health maintenance reviewed and UTD. Performed STD screening. Encouraged continuing a well-balanced diet and staying physically active.    Follow up annually or sooner if needed w/ PCP.   Patriciaann Clan, [place]

## 2019-07-18 NOTE — Patient Instructions (Signed)
Wonderful to see you today!  Good luck with rest your fellowship.  I will let you know the results of your labs/urine within the next week.  Please make sure you continue to follow a well-balanced diet and stay physically active as you have been doing.  Follow-up with Dr. Ouida Sills as scheduled.

## 2019-07-18 NOTE — Telephone Encounter (Signed)
Called pt to see what he was needing to speak to myself or Dr. Higinio Plan about and he was inquiring about the PHQ-2 questions and I explained to him how they work and he said that his service today was very good and he appreciated everything from today.Joseph Santiago, CMA

## 2019-07-19 LAB — HIV ANTIBODY (ROUTINE TESTING W REFLEX): HIV Screen 4th Generation wRfx: NONREACTIVE

## 2019-07-19 LAB — URINE CYTOLOGY ANCILLARY ONLY
Chlamydia: NEGATIVE
Comment: NEGATIVE
Comment: NEGATIVE
Comment: NORMAL
Neisseria Gonorrhea: NEGATIVE
Trichomonas: NEGATIVE

## 2019-07-19 LAB — HEPATITIS C ANTIBODY: Hep C Virus Ab: <0.1 s/co ratio (ref 0.0–0.9)

## 2019-07-19 LAB — RPR: RPR Ser Ql: NONREACTIVE

## 2019-07-22 ENCOUNTER — Encounter: Payer: Self-pay | Admitting: Family Medicine

## 2019-07-22 DIAGNOSIS — Z0000 Encounter for general adult medical examination without abnormal findings: Secondary | ICD-10-CM | POA: Insufficient documentation

## 2019-07-22 NOTE — Assessment & Plan Note (Signed)
Reviewed past medical, surgical, and social history.  Medications updated in epic.  Health maintenance reviewed and UTD. Performed STD screening. Encouraged continuing a well-balanced diet and staying physically active.

## 2019-08-08 ENCOUNTER — Encounter: Payer: 59 | Admitting: Student in an Organized Health Care Education/Training Program

## 2019-08-27 MED FILL — MYRBETRIQ ER 50 MG TABLET: 50 | 30 days supply | Qty: 30 | Fill #1

## 2019-09-27 MED FILL — MYRBETRIQ ER 50 MG TABLET: 50 | 30 days supply | Qty: 30 | Fill #2

## 2019-10-21 DIAGNOSIS — Z03818 Encounter for observation for suspected exposure to other biological agents ruled out: Secondary | ICD-10-CM | POA: Diagnosis not present

## 2019-10-31 MED FILL — MYRBETRIQ ER 50 MG TABLET: 50 | 30 days supply | Qty: 30 | Fill #3

## 2019-11-01 DIAGNOSIS — Z03818 Encounter for observation for suspected exposure to other biological agents ruled out: Secondary | ICD-10-CM | POA: Diagnosis not present

## 2019-11-28 DIAGNOSIS — Z03818 Encounter for observation for suspected exposure to other biological agents ruled out: Secondary | ICD-10-CM | POA: Diagnosis not present

## 2019-12-03 ENCOUNTER — Telehealth: Payer: Self-pay | Admitting: Cardiovascular Disease

## 2019-12-03 DIAGNOSIS — Z03818 Encounter for observation for suspected exposure to other biological agents ruled out: Secondary | ICD-10-CM | POA: Diagnosis not present

## 2019-12-03 DIAGNOSIS — E781 Pure hyperglyceridemia: Secondary | ICD-10-CM

## 2019-12-03 NOTE — Telephone Encounter (Signed)
Per Dr. Elmarie Shiley office note, he would like for pt to have Lipid, Liver and BMET drawn the week before his appt.  Called pt and left message to call back.  Orders have been placed.

## 2019-12-03 NOTE — Telephone Encounter (Signed)
Joseph Santiago is calling wanting to know if Dr. Acie Fredrickson is wanting labs on the day of his 1 year f/u due to there being no active request in the system. Please advise.

## 2019-12-06 MED FILL — MYRBETRIQ ER 50 MG TABLET: 50 | 30 days supply | Qty: 30 | Fill #4

## 2019-12-12 DIAGNOSIS — H52223 Regular astigmatism, bilateral: Secondary | ICD-10-CM | POA: Diagnosis not present

## 2020-01-14 MED FILL — MYRBETRIQ ER 50 MG TABLET: 50 | 30 days supply | Qty: 30 | Fill #5

## 2020-01-17 DIAGNOSIS — Z03818 Encounter for observation for suspected exposure to other biological agents ruled out: Secondary | ICD-10-CM | POA: Diagnosis not present

## 2020-02-06 DIAGNOSIS — Z03818 Encounter for observation for suspected exposure to other biological agents ruled out: Secondary | ICD-10-CM | POA: Diagnosis not present

## 2020-02-17 DIAGNOSIS — Z03818 Encounter for observation for suspected exposure to other biological agents ruled out: Secondary | ICD-10-CM | POA: Diagnosis not present

## 2020-02-19 MED FILL — MYRBETRIQ ER 50 MG TABLET: 50 | 30 days supply | Qty: 30 | Fill #6

## 2020-02-25 ENCOUNTER — Other Ambulatory Visit: Payer: Self-pay

## 2020-02-25 ENCOUNTER — Other Ambulatory Visit: Payer: 59 | Admitting: *Deleted

## 2020-02-25 DIAGNOSIS — Z03818 Encounter for observation for suspected exposure to other biological agents ruled out: Secondary | ICD-10-CM | POA: Diagnosis not present

## 2020-02-25 DIAGNOSIS — E781 Pure hyperglyceridemia: Secondary | ICD-10-CM | POA: Diagnosis not present

## 2020-02-25 LAB — HEPATIC FUNCTION PANEL
ALT: 36 IU/L (ref 0–44)
AST: 24 IU/L (ref 0–40)
Albumin: 4.6 g/dL (ref 4.0–5.0)
Alkaline Phosphatase: 56 IU/L (ref 44–121)
Bilirubin Total: 0.9 mg/dL (ref 0.0–1.2)
Bilirubin, Direct: 0.19 mg/dL (ref 0.00–0.40)
Total Protein: 7.1 g/dL (ref 6.0–8.5)

## 2020-02-25 LAB — LIPID PANEL
Chol/HDL Ratio: 3.3 ratio (ref 0.0–5.0)
Cholesterol, Total: 202 mg/dL — ABNORMAL HIGH (ref 100–199)
HDL: 62 mg/dL (ref >39)
LDL Chol Calc (NIH): 125 mg/dL — ABNORMAL HIGH (ref 0–99)
Triglycerides: 82 mg/dL (ref 0–149)
VLDL Cholesterol Cal: 15 mg/dL (ref 5–40)

## 2020-02-25 LAB — BASIC METABOLIC PANEL
BUN/Creatinine Ratio: 10 (ref 9–20)
BUN: 12 mg/dL (ref 6–20)
CO2: 25 mmol/L (ref 20–29)
Calcium: 9.7 mg/dL (ref 8.7–10.2)
Chloride: 105 mmol/L (ref 96–106)
Creatinine, Ser: 1.21 mg/dL (ref 0.76–1.27)
GFR calc Af Amer: 91 mL/min/{1.73_m2} (ref >59)
GFR calc non Af Amer: 79 mL/min/{1.73_m2} (ref >59)
Glucose: 84 mg/dL (ref 65–99)
Potassium: 4.1 mmol/L (ref 3.5–5.2)
Sodium: 141 mmol/L (ref 134–144)

## 2020-03-01 ENCOUNTER — Encounter: Payer: Self-pay | Admitting: Cardiovascular Disease

## 2020-03-01 NOTE — Progress Notes (Signed)
Cardiology Office Note:    Date:  03/03/2020   ID:  [name], DOB [birth year], MRN [id number]  PCP:  Leeroy Bock, DO  Cardiologist:  Wyley Hack  Electrophysiologist:  None   Referring MD: Leeroy Bock, DO   Chief Complaint  Patient presents with  . Hyperlipidemia    History of Present Illness:    Joseph Santiago is a 35 y.o. male with a hx of HTN, hyperlipidemia   We were asked to see him today by Dr. Dareen Piano for further evaluation of his hx of HTN and hyperlipiemia .    He is in the [employer] program at [employer].  Has a hx of hyperlipidemia and HTN Has had lifestyle changes since being diagnoses with HTN and HLD . His HLD has improved.  BP is now normal.      2 years ago was significant tachypalpitations and was having trouble sleeping.  He had an extensive work-up at [facility].  A limited echocardiogram revealed normal left ventricular size and function.  Ejection fraction was 60 to 65%.  He had trace mitral regurgitation, mild tricuspid regurgitation.  Right ventricular pressures were normal.  CTA of the chest revealed no evidence of pulmonary embolus.  There were no other pulmonary or cardiac concerns seen on the CT scan.  He has improved his diet and exercise / workouts.  No CP or dyspnea  Non smoker Does not drink   Only med is flomax to help prevent nocturia  Does 3  days a week of cardio  And does weight on the same days   + family hx of HTN .  Jan. 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  He will then make a decision about his next step. Has lost 30 lbs since I last saw him Change in his diet, exercise  His mild CKD - has had HTN for years Lipids were reviewed. Lipids are up slightly .  Has a family hx of premature CAD       Past Medical History:  Diagnosis Date  . Hypertension     History reviewed. No pertinent surgical  history.  Current Medications: Current Meds  Medication Sig  . ibuprofen (ADVIL) 600 MG tablet Take 1 tablet (600 mg total) by mouth every 6 (six) hours as needed.  Marland Kitchen MYRBETRIQ 50 MG TB24 tablet Take 50 mg by mouth daily.  Marland Kitchen triamcinolone cream (KENALOG) 0.1 % Apply 1 application topically 2 (two) times daily.     Allergies:   Bactrim [sulfamethoxazole-trimethoprim]   Social History   Socioeconomic History  . Marital status: Single    Spouse name: Not on file  . Number of children: Not on file  . Years of education: Not on file  . Highest education level: Not on file  Occupational History  . Not on file  Tobacco Use  . Smoking status: Never Smoker  . Smokeless tobacco: Never Used  Substance and Sexual Activity  . Alcohol use: No  . Drug use: No  . Sexual activity: Not on file  Other Topics Concern  . Not on file  Social History Narrative  . Not on file   Social Determinants of Health   Financial Resource Strain: Not on file  Food Insecurity: Not on file  Transportation Needs: Not on file  Physical Activity: Not on file  Stress: Not on file  Social Connections: Not on file  Family History: The patient's family history includes Hypertension in his father and mother.  ROS:   Please see the history of present illness.     All other systems reviewed and are negative.  EKGs/Labs/Other Studies Reviewed:        Recent Labs: 02/25/2020: ALT 36; BUN 12; Creatinine, Ser 1.21; Potassium 4.1; Sodium 141  Recent Lipid Panel    Component Value Date/Time   CHOL 202 (H) 02/25/2020 0759   TRIG 82 02/25/2020 0759   HDL 62 02/25/2020 0759   CHOLHDL 3.3 02/25/2020 0759   LDLCALC 125 (H) 02/25/2020 0759    Physical Exam:    Physical Exam: Blood pressure 94/62, pulse 60, height 5\' 7"  (1.702 m), weight 173 lb 9.6 oz (78.7 kg), SpO2 97 %.  GEN:  Well nourished, well developed in no acute distress HEENT: Normal NECK: No JVD; No carotid bruits LYMPHATICS: No  lymphadenopathy CARDIAC: RRR , no murmurs, rubs, gallops RESPIRATORY:  Clear to auscultation without rales, wheezing or rhonchi  ABDOMEN: Soft, non-tender, non-distended MUSCULOSKELETAL:  No edema; No deformity  SKIN: Warm and dry NEUROLOGIC:  Alert and oriented x 3  EKG:    March 03, 2020: Normal sinus rhythm at 60.  No ST or T wave changes.   ASSESSMENT:    1. Pure hypertriglyceridemia   2. Hyperlipidemia, unspecified hyperlipidemia type    PLAN:      1. History of hypertension: He is lost quite a bit of weight.  His blood pressure is well controlled today.    2.  Hypertriglyceridemia.  We reviewed his recent cholesterol labs.  His LDL is 125.  He still has a bit of improvement that he can do with his diet.  He has worked very hard and is lost a good bit of weight which is great.  We will recheck his labs in 6 months.  We will have him see my APP around that time.  We discussed getting a coronary calcium score-especially if his lipids are still not well controlled and if he has some resistance on starting a statin.  I think the coronary calcium score can help "move the needle" and help [place] decide whether or not he actually needs to start a statin medication.    Medication Adjustments/Labs and Tests Ordered: Current medicines are reviewed at length with the patient today.  Concerns regarding medicines are outlined above.  Orders Placed This Encounter  Procedures  . Hepatic function panel  . Lipid panel  . Basic metabolic panel  . EKG 12-Lead   No orders of the defined types were placed in this encounter.   Patient Instructions  Medication Instructions:  Your provider recommends that you continue on your current medications as directed. Please refer to the Current Medication list given to you today.   *If you need a refill on your cardiac medications before your next appointment, please call your pharmacy*  Follow-Up: At [facility], you and your health needs are  our priority.  As part of our continuing mission to provide you with exceptional heart care, we have created designated Provider Care Teams.  These Care Teams include your primary Cardiologist (physician) and Advanced Practice Providers (APPs -  Physician Assistants and Nurse Practitioners) who all work together to provide you with the care you need, when you need it. Your next appointment:   6 month(s) The format for your next appointment:   In Person Provider:   You will see one of the following Advanced Practice Providers on your  designated Care Team:    Richardson Dopp, PA-C  Robbie Lis, [place]     Signed, Mertie Moores, MD  03/03/2020 5:50 PM    [place]

## 2020-03-03 ENCOUNTER — Encounter: Payer: Self-pay | Admitting: Cardiovascular Disease

## 2020-03-03 ENCOUNTER — Other Ambulatory Visit: Payer: Self-pay

## 2020-03-03 ENCOUNTER — Ambulatory Visit: Payer: 59 | Admitting: Cardiovascular Disease

## 2020-03-03 VITALS — BP 94/62 | HR 60 | Ht 67.0 in | Wt 173.6 lb

## 2020-03-03 DIAGNOSIS — E785 Hyperlipidemia, unspecified: Secondary | ICD-10-CM | POA: Diagnosis not present

## 2020-03-03 DIAGNOSIS — E781 Pure hyperglyceridemia: Secondary | ICD-10-CM | POA: Diagnosis not present

## 2020-03-03 NOTE — Patient Instructions (Addendum)
Medication Instructions:  Your provider recommends that you continue on your current medications as directed. Please refer to the Current Medication list given to you today.   *If you need a refill on your cardiac medications before your next appointment, please call your pharmacy*  Follow-Up: At [facility], you and your health needs are our priority.  As part of our continuing mission to provide you with exceptional heart care, we have created designated Provider Care Teams.  These Care Teams include your primary Cardiologist (physician) and Advanced Practice Providers (APPs -  Physician Assistants and Nurse Practitioners) who all work together to provide you with the care you need, when you need it. Your next appointment:   6 month(s) The format for your next appointment:   In Person Provider:   You will see one of the following Advanced Practice Providers on your designated Care Team:    Scott Weaver, PA-C  Vin Bhagat, PA-C 

## 2020-03-05 DIAGNOSIS — Z03818 Encounter for observation for suspected exposure to other biological agents ruled out: Secondary | ICD-10-CM | POA: Diagnosis not present

## 2020-03-09 NOTE — Progress Notes (Signed)
Telehealth Encounter  PCP Doristine Mango, DO I connected with Joseph Santiago (MRN [id number]) on 03/10/2020 by MyChart video-enabled, HIPAA-compliant telemedicine application, verified that I was speaking with the correct person using two identifiers, and that the patient was in a private environment conducive to confidentiality.  The patient agreed to proceed.  Persons participating in visit were patient and provider (registered dietitian) Kennith Center, PhD, RD, LDN, CEDRD. Provider was located at [place] during this telehealth encounter; patient was at work Teaching laboratory technician at [employer]).  Appt start time: 1100 end time: 1200 (1 hour)  Reason for telehealth visit: Referred by cardiologist Cleatrice Burke, MD for Medical Nutrition Therapy related to hyperlipidemia (E78.5).  Joseph Santiago has a goal weight of ~166 lb.    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  Used to train and eat specifically for sport  (8th grade through sophomore yr in college).  Family history of cancer, CVD, HTN, and DM.  In addition to HLD, Joseph Santiago has a h/o HTN, which is now controlled with diet and lifestyle.  He has lost 30 lb since Aug 2021, although LDL has increased.   02/25/20 lipids: LDL 125, total cholest 202, HDL 62; TG 82.    Assessment: Joseph Santiago has made substantial changes since his first MNT appt in fall 2021.  He wants to see if he can further optimize his diet to lower his LDL.   Usual eating pattern: 3 meals and 1 snack per day. Frequent foods and beverages: alkaline water, (occasional diluted juice); soy analogues, fish, brn rice, fruit & veg's,  PB&J sandw's, salads, eggs (daily), Fr toast (3 X wk), alm milk w/ Honey Nut Cheerios.   Avoided foods: most red and processed meat, most dairy, okra, pigs feet, sweets, packaged snack foods.   Usual physical activity: ~90 min wt training 4 X wk; 300 abs  and walks 4 X wk at least 1 mile 4 X wk.  No exercise on Thur, Fri, Sat.   Sleep: Estimates he gets ~6 hrs per night.   Bedtime 12 or 1 AM, up 6 or 7 AM usually.  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  24-hr recall:  (Up at 9 AM) B ( AM)-  16 oz alkaline water Snk ( AM)-  --- L (1 PM)-  2 c Honey Nut Cheerios, 1 c almond milk Snk (3 PM)-  --- D (5 PM)-  1 chx salad sandw, pickle slices, water Snk ( PM)-  16 oz alkaline water Snk (10 PM)-  1 chx salad sandw, 1/3 bag Skinny Mwave popcorn, water Typical day? No.  Usually eats bkfast, and usually has veg and fruit.  Lunch is usually a sandw or dinner leftovers.    Intervention: Completed diet and exercise history, and re-established behavioral goals.  For recommendations and goals, see Patient Instructions.    Follow-up: Telehealth visit in 4 weeks.    Avereigh Spainhower,JEANNIE

## 2020-03-10 ENCOUNTER — Ambulatory Visit (INDEPENDENT_AMBULATORY_CARE_PROVIDER_SITE_OTHER): Payer: 59 | Admitting: Family Medicine

## 2020-03-10 ENCOUNTER — Other Ambulatory Visit: Payer: Self-pay

## 2020-03-10 DIAGNOSIS — Z713 Dietary counseling and surveillance: Secondary | ICD-10-CM | POA: Diagnosis not present

## 2020-03-10 NOTE — Patient Instructions (Addendum)
Notes (with some additions) from 10/15 appt: Ways to help lower LDL: - Limit saturated fats (mostly animal fats, and also vegetable shortening, commercially fried foods, tropical fats such as palm and coconut).   - Use primarily extra virgin olive oil, which is a mono-unsaturated fat.  This type of fat helps lower LDL without lowering HDL.  It's best to substitute olive oil or canola oil in place of fake butter or vegetable oil.   - DO eat plenty of:             - Fruits and vegetables. The soluble fiber in these plant foods can help lower LDL.  For both lunch and dinner, get half the volume of your meal from vegetables.               - Dried beans and peas (legumes), another source of soluble fiber. Aim for at least 1 full cup of beans 3 X wk.  Look for no-salt-added beans.               - Unsalted nuts and seeds, which provide both poly- and mono-unsaturated fats.             - Fatty fish such as salmon and mackerel, excellent sources of omega-3 fats, which also help lower cholesterol (among other health benefits).  Aim for getting non-fried fish 3 X wk.    - Avocado is also a good source of mono-unsaturated fat.   Soy foods and meat analogues can be helpful as a replacement for meats or other protein sources of saturated fat.  (Be cautious about sodium levels of many of these products, however.  To manage your blood pressure, you'll want to limit your sodium intake to 1500 mg per day.)   - Use the Meal Planning Form provided today to determine some meals that are relatively quick and easy to prepare, and that also meet your nutritional needs.    - Omega-3 supplements may be helpful in lowering LDL.  Look for a supplement that lists the exact amount of DHA and EPA.  You want at least 1200 mg total DHA and EPA.    - Read the handout provided today on the OmniHeart trial, which explored ways to improve on the DASH diet.    Supplements: - Garlic - Evidence for benefiting lipids is equivocal,  although it has been shown to have a number of health benefits; however, I would recommend you try to use more garlic in food preparation.  If you cook with garlic, be sure to mince it at least 10 min before heat exposure.  (See book, "Eating on the Wild Side" by Arrie Eastern.)  - Flax seeds:  You may want to use ground flax (up to a heaping tablespoon), which can help to lower LDL also.  Keep it refrigerated!    Follow-up appt via MyChart video platform: Thursday, Feb 10 at 3:30 PM .

## 2020-03-31 ENCOUNTER — Other Ambulatory Visit (HOSPITAL_COMMUNITY): Payer: Self-pay | Admitting: Dermatology

## 2020-03-31 DIAGNOSIS — L918 Other hypertrophic disorders of the skin: Secondary | ICD-10-CM | POA: Diagnosis not present

## 2020-03-31 DIAGNOSIS — L700 Acne vulgaris: Secondary | ICD-10-CM | POA: Diagnosis not present

## 2020-03-31 MED FILL — MYRBETRIQ ER 50 MG TABLET: 50 | 30 days supply | Qty: 30 | Fill #0

## 2020-04-03 MED FILL — TRETINOIN 0.025% CREAM: 0.025 | 30 days supply | Qty: 45 | Fill #0

## 2020-04-09 ENCOUNTER — Telehealth: Payer: 59 | Admitting: Family Medicine

## 2020-04-29 DIAGNOSIS — J3089 Other allergic rhinitis: Secondary | ICD-10-CM | POA: Diagnosis not present

## 2020-04-29 DIAGNOSIS — L709 Acne, unspecified: Secondary | ICD-10-CM | POA: Diagnosis not present

## 2020-04-29 DIAGNOSIS — J309 Allergic rhinitis, unspecified: Secondary | ICD-10-CM | POA: Diagnosis not present

## 2020-05-01 MED FILL — MYRBETRIQ ER 50 MG TABLET: 50 | 30 days supply | Qty: 30 | Fill #1

## 2020-05-30 ENCOUNTER — Ambulatory Visit (HOSPITAL_COMMUNITY)
Admission: EM | Admit: 2020-05-30 | Discharge: 2020-05-30 | Disposition: A | Discharge status: Home / Self Care | Payer: 59 | Attending: Urgent Care | Admitting: Urgent Care

## 2020-05-30 ENCOUNTER — Other Ambulatory Visit: Payer: Self-pay

## 2020-05-30 ENCOUNTER — Encounter (HOSPITAL_COMMUNITY): Payer: Self-pay

## 2020-05-30 DIAGNOSIS — R519 Headache, unspecified: Secondary | ICD-10-CM | POA: Diagnosis not present

## 2020-05-30 DIAGNOSIS — J029 Acute pharyngitis, unspecified: Secondary | ICD-10-CM

## 2020-05-30 DIAGNOSIS — R6883 Chills (without fever): Secondary | ICD-10-CM | POA: Diagnosis not present

## 2020-05-30 DIAGNOSIS — R6889 Other general symptoms and signs: Secondary | ICD-10-CM

## 2020-05-30 DIAGNOSIS — R52 Pain, unspecified: Secondary | ICD-10-CM | POA: Diagnosis not present

## 2020-05-30 DIAGNOSIS — R070 Pain in throat: Secondary | ICD-10-CM | POA: Diagnosis not present

## 2020-05-30 DIAGNOSIS — U071 COVID-19: Secondary | ICD-10-CM | POA: Insufficient documentation

## 2020-05-30 DIAGNOSIS — Z881 Allergy status to other antibiotic agents status: Secondary | ICD-10-CM | POA: Diagnosis not present

## 2020-05-30 DIAGNOSIS — B349 Viral infection, unspecified: Secondary | ICD-10-CM | POA: Diagnosis not present

## 2020-05-30 LAB — SARS CORONAVIRUS 2 (TAT 6-24 HRS): SARS Coronavirus 2: POSITIVE — AB

## 2020-05-30 LAB — POC INFLUENZA A AND B ANTIGEN (URGENT CARE ONLY)
Influenza A Ag: NEGATIVE
Influenza B Ag: NEGATIVE

## 2020-05-30 MED ORDER — NAPROXEN 500 MG PO TABS: 500.0000 mg | ORAL_TABLET | Freq: Two times a day (BID) | ORAL | 0 refills | Status: DC

## 2020-05-30 NOTE — ED Triage Notes (Signed)
Pt c/o chills, body aches, sore throat, headaches X 2 days. He states he took Claritin and Tylenol.

## 2020-05-30 NOTE — Discharge Instructions (Addendum)

## 2020-05-30 NOTE — ED Provider Notes (Signed)
[place]   MRN: [id number] DOB: 09-13-1985  Subjective:   Joseph Santiago is a 35 y.o. male presenting for 2-day history of acute onset chills, body aches, throat pain, headaches.  No current facility-administered medications for this encounter.  Current Outpatient Medications:  .  ibuprofen (ADVIL) 600 MG tablet, Take 1 tablet (600 mg total) by mouth every 6 (six) hours as needed., Disp: 30 tablet, Rfl: 0 .  MYRBETRIQ 50 MG TB24 tablet, Take 50 mg by mouth daily., Disp: , Rfl:  .  triamcinolone cream (KENALOG) 0.1 %, Apply 1 application topically 2 (two) times daily., Disp: 45 g, Rfl: 0   Allergies  Allergen Reactions  . Bactrim [Sulfamethoxazole-Trimethoprim] Hives and Swelling    Throat closes up    Past Medical History:  Diagnosis Date  . Hypertension      History reviewed. No pertinent surgical history.  Family History  Problem Relation Age of Onset  . Hypertension Mother   . Hypertension Father     Social History   Tobacco Use  . Smoking status: Never Smoker  . Smokeless tobacco: Never Used  Substance Use Topics  . Alcohol use: No  . Drug use: No    ROS   Objective:   Vitals: BP 107/67   Pulse 87   Temp 99.7 F (37.6 C) (Oral)   Resp 18   SpO2 99%   Physical Exam Constitutional:      General: He is not in acute distress.    Appearance: Normal appearance. He is well-developed and normal weight. He is not ill-appearing, toxic-appearing or diaphoretic.  HENT:     Head: Normocephalic and atraumatic.     Right Ear: Tympanic membrane, ear canal and external ear normal. There is no impacted cerumen.     Left Ear: Tympanic membrane, ear canal and external ear normal. There is no impacted cerumen.     Nose: Nose normal. No congestion or rhinorrhea.     Mouth/Throat:     Mouth: Mucous membranes are moist.     Pharynx: Oropharynx is clear. No oropharyngeal exudate or posterior oropharyngeal erythema.  Eyes:     General: No  scleral icterus.       Right eye: No discharge.        Left eye: No discharge.     Extraocular Movements: Extraocular movements intact.     Conjunctiva/sclera: Conjunctivae normal.     Pupils: Pupils are equal, round, and reactive to light.  Cardiovascular:     Rate and Rhythm: Normal rate and regular rhythm.     Heart sounds: Normal heart sounds. No murmur heard. No friction rub. No gallop.   Pulmonary:     Effort: Pulmonary effort is normal. No respiratory distress.     Breath sounds: Normal breath sounds. No stridor. No wheezing, rhonchi or rales.  Musculoskeletal:     Cervical back: Normal range of motion and neck supple. No rigidity. No muscular tenderness.  Neurological:     General: No focal deficit present.     Mental Status: He is alert and oriented to person, place, and time.  Psychiatric:        Mood and Affect: Mood normal.        Behavior: Behavior normal.        Thought Content: Thought content normal.        Judgment: Judgment normal.      Results for orders placed or performed during the hospital encounter of 05/30/20 (from the  past 24 hour(s))  POC Influenza A & B Ag (Urgent Care)     Status: None   Collection Time: 05/30/20  3:30 PM  Result Value Ref Range   Influenza A Ag NEGATIVE NEGATIVE   Influenza B Ag NEGATIVE NEGATIVE    Assessment and Plan :   PDMP not reviewed this encounter.  1. Viral syndrome   2. Flu-like symptoms     Will manage for viral illness such as viral URI, viral syndrome, viral rhinitis, COVID-19. Counseled patient on nature of COVID-19 including modes of transmission, diagnostic testing, management and supportive care.  Offered scripts for symptomatic relief. COVID 19 testing is pending. Counseled patient on potential for adverse effects with medications prescribed/recommended today, ER and return-to-clinic precautions discussed, patient verbalized understanding.     Jaynee Eagles, [place] 05/30/20 1557

## 2020-05-31 ENCOUNTER — Telehealth: Payer: Self-pay | Admitting: Physician Assistant

## 2020-05-31 ENCOUNTER — Encounter: Payer: Self-pay | Admitting: Physician Assistant

## 2020-05-31 DIAGNOSIS — Z6830 Body mass index (BMI) 30.0-30.9, adult: Secondary | ICD-10-CM | POA: Insufficient documentation

## 2020-05-31 DIAGNOSIS — I10 Essential (primary) hypertension: Secondary | ICD-10-CM | POA: Insufficient documentation

## 2020-05-31 NOTE — Telephone Encounter (Signed)
Pt called me back and would like to think about it.   KT

## 2020-05-31 NOTE — Telephone Encounter (Signed)
Called to discuss with patient about COVID-19 symptoms and the use of one of the available treatments for those with mild to moderate Covid symptoms and at a high risk of hospitalization.  Pt appears to qualify for outpatient treatment due to co-morbid conditions and/or a member of an at-risk group in accordance with the FDA Emergency Use Authorization.    Symptom onset: ~3/31 per ER notes but did not talk to pt Vaccinated: unknown Booster? unknown Immunocompromised? no Qualifiers: HTN, BMI  Unable to reach pt - left VM and mychart msg  [employer]

## 2020-06-01 ENCOUNTER — Encounter: Payer: Self-pay | Admitting: Physician Assistant

## 2020-06-01 ENCOUNTER — Other Ambulatory Visit (HOSPITAL_COMMUNITY): Payer: Self-pay

## 2020-06-01 ENCOUNTER — Telehealth: Payer: 59 | Admitting: Physician Assistant

## 2020-06-01 DIAGNOSIS — U071 COVID-19: Secondary | ICD-10-CM

## 2020-06-01 MED ORDER — CARESTART COVID-19 HOME TEST VI KIT
PACK | 0 refills | Status: DC
  Filled 2020-06-01: qty 2, 2d supply, fill #0

## 2020-06-01 MED ORDER — NIRMATRELVIR/RITONAVIR (PAXLOVID)TABLET
ORAL_TABLET | ORAL | 0 refills | Status: DC
  Filled 2020-06-01: qty 30, 5d supply, fill #0

## 2020-06-01 MED ORDER — NIRMATRELVIR/RITONAVIR (PAXLOVID)TABLET
3.0000 | ORAL_TABLET | Freq: Two times a day (BID) | ORAL | 0 refills | Status: DC
  Filled 2020-06-01: qty 30, 5d supply, fill #0

## 2020-06-01 NOTE — Patient Instructions (Signed)
Instructions sent to patients MyChart.

## 2020-06-01 NOTE — Progress Notes (Signed)
[name], mchaffie are scheduled for a virtual visit with your provider today.    Just as we do with appointments in the office, we must obtain your consent to participate.  Your consent will be active for this visit and any virtual visit you may have with one of our providers in the next 365 days.    If you have a MyChart account, I can also send a copy of this consent to you electronically.  All virtual visits are billed to your insurance company just like a traditional visit in the office.  As this is a virtual visit, video technology does not allow for your provider to perform a traditional examination.  This may limit your provider's ability to fully assess your condition.  If your provider identifies any concerns that need to be evaluated in person or the need to arrange testing such as labs, EKG, etc, we will make arrangements to do so.    Although advances in technology are sophisticated, we cannot ensure that it will always work on either your end or our end.  If the connection with a video visit is poor, we may have to switch to a telephone visit.  With either a video or telephone visit, we are not always able to ensure that we have a secure connection.   I need to obtain your verbal consent now.   Are you willing to proceed with your visit today?   Joseph Santiago has provided verbal consent on 06/01/2020 for a virtual visit (video or telephone).  Leeanne Rio, PA-C 06/01/2020  2:45 PM    Virtual Visit via Video   I connected with patient on 06/01/20 at  2:45 PM EDT by a video enabled telemedicine application and verified that I am speaking with the correct person using two identifiers.  Location patient: Home Location provider: [place] participating in the virtual visit: Patient, Provider  I discussed the limitations of evaluation and management by telemedicine and the availability of in person appointments. The patient expressed understanding and agreed  to proceed.  Subjective:   HPI:   Patient presents via [place] today wanting to start medication for COVID.  Patient endorses symptoms starting last Friday at which time he was evaluated at urgent care.  Tested positive for Covid.  His information was sent to our Covid treatment team who evaluated patient and reached out to him yesterday to discuss outpatient treatment options including packs located infused antivirals.  Patient told them he wanted to think about it.  Today notes fatigue is worse and he would like to proceed with outpatient treatment.  States he tried to reach him multiple times but cannot get a call back.  Is wanting to get started on things as soon as possible.  Today he notes no fever, no vomiting, body aches or chills.  Notes still having significant congestion.  Also noting headache.  Denies sinus pain, ear pain or tooth pain.  Denies any chest pain or shortness of breath.  Is taking naproxen as recommended by the urgent care provider but has not taken any other medications for symptoms.  ROS:   See pertinent positives and negatives per HPI.  Patient Active Problem List   Diagnosis Date Noted  . BMI 30.0-30.9,adult   . Hypertension   . Annual physical exam 07/22/2019  . Hyperlipidemia 02/15/2019  . Anxiety about health 12/29/2018  . Epidermoid cyst 12/26/2018  . Hyperlipemia 11/30/2018  . Epididymal cyst 11/28/2018  . Nocturnal  polyuria 11/28/2018    Social History   Tobacco Use  . Smoking status: Never Smoker  . Smokeless tobacco: Never Used  Substance Use Topics  . Alcohol use: No    Current Outpatient Medications:  .  ibuprofen (ADVIL) 600 MG tablet, Take 1 tablet (600 mg total) by mouth every 6 (six) hours as needed., Disp: 30 tablet, Rfl: 0 .  mirabegron ER (MYRBETRIQ) 50 MG TB24 tablet, TAKE 1 TABLET BY MOUTH ONCE DAILY, Disp: 30 tablet, Rfl: 6 .  MYRBETRIQ 50 MG TB24 tablet, Take 50 mg by mouth daily., Disp: , Rfl:  .  naproxen (NAPROSYN) 500 MG  tablet, Take 1 tablet (500 mg total) by mouth 2 (two) times daily with a meal., Disp: 30 tablet, Rfl: 0 .  tretinoin (RETIN-A) 0.025 % cream, APPLY DIME-SIZE AMOUNT TO ENTIRE FACE NIGHTLY AS TOLERATED, Disp: 45 g, Rfl: 3 .  triamcinolone cream (KENALOG) 0.1 %, Apply 1 application topically 2 (two) times daily., Disp: 45 g, Rfl: 0  Allergies  Allergen Reactions  . Bactrim [Sulfamethoxazole-Trimethoprim] Hives and Swelling    Throat closes up    Objective:   There were no vitals taken for this visit.  Patient is well-developed, well-nourished in no acute distress.  Resting comfortabl at home.  Head is normocephalic, atraumatic.  No labored breathing.  Speech is clear and coherent with logical content.  Patient is alert and oriented at baseline.   Assessment and Plan:   1. COVID-19 Confirmed positive test in system.  He is within a week of symptom onset.  He is young and with no noted significant medical issues that increase immunocompromise.  His ethnicity does place him at a higher risk statistically for more severe infection and complications.  Did speak with Covid treatment team Angelena Form, PA-C and Wilber Bihari, BP)  who verified they discussed with the patient he is candidate for oral Paxlovid  with patient. WE reviewed proper dosing based on his GFR and medication was sent to the patient's pharmacy.  Also discussed with patient that he should start a combination of vitamin D, vitamin C and zinc, as well as using Mucinex to help thin congestion.  Hydration rest recommended.  Patient enrolled in [place] home monitoring program.  Strict PCP follow-up precautions discussed with patient.  Strict ER precautions discussed with patient who voiced understanding and agreement with plan.    Leeanne Rio, [place] 06/01/2020

## 2020-06-02 ENCOUNTER — Encounter (HOSPITAL_COMMUNITY): Payer: Self-pay | Admitting: Emergency Medicine

## 2020-06-02 ENCOUNTER — Other Ambulatory Visit (HOSPITAL_COMMUNITY): Payer: Self-pay

## 2020-06-02 ENCOUNTER — Ambulatory Visit (HOSPITAL_COMMUNITY)
Admission: EM | Admit: 2020-06-02 | Discharge: 2020-06-02 | Disposition: A | Discharge status: Home / Self Care | Payer: 59 | Attending: Emergency Medicine | Admitting: Emergency Medicine

## 2020-06-02 ENCOUNTER — Ambulatory Visit (INDEPENDENT_AMBULATORY_CARE_PROVIDER_SITE_OTHER): Payer: 59

## 2020-06-02 ENCOUNTER — Other Ambulatory Visit: Payer: Self-pay

## 2020-06-02 DIAGNOSIS — U071 COVID-19: Secondary | ICD-10-CM | POA: Diagnosis not present

## 2020-06-02 DIAGNOSIS — R062 Wheezing: Secondary | ICD-10-CM | POA: Diagnosis not present

## 2020-06-02 DIAGNOSIS — R058 Other specified cough: Secondary | ICD-10-CM

## 2020-06-02 DIAGNOSIS — R0602 Shortness of breath: Secondary | ICD-10-CM | POA: Diagnosis not present

## 2020-06-02 DIAGNOSIS — R059 Cough, unspecified: Secondary | ICD-10-CM | POA: Diagnosis not present

## 2020-06-02 MED ORDER — ALBUTEROL SULFATE HFA 108 (90 BASE) MCG/ACT IN AERS
2.0000 | INHALATION_SPRAY | RESPIRATORY_TRACT | 0 refills | Status: DC | PRN
  Filled 2020-06-02: qty 18, 17d supply, fill #0

## 2020-06-02 NOTE — ED Provider Notes (Addendum)
[place]    CSN: [id number] Arrival date & time: 06/02/20  9678      History   Chief Complaint Chief Complaint  Patient presents with  . covid (+) 05/30/2020  . Cough    HPI Joseph Santiago is a 35 y.o. male.   Patient presents positive for Covid 19 on 05/30/2020. Monitored oxygen level overnight with lowest level 94%. Has intermittent periods of shortness of breath at rest and while attempting to rest. Discussed with nurse on insurance line who recommended reevaluation. Associated covid symptoms of dry cough, congestion, rhinorrhea, fatigue, body aches, chills, headache, sore throat. Denies fevers, chest pain, chest tightness.   Past Medical History:  Diagnosis Date  . BMI 30.0-30.9,adult   . Hypertension     Patient Active Problem List   Diagnosis Date Noted  . BMI 30.0-30.9,adult   . Hypertension   . Annual physical exam 07/22/2019  . Hyperlipidemia 02/15/2019  . Anxiety about health 12/29/2018  . Epidermoid cyst 12/26/2018  . Hyperlipemia 11/30/2018  . Epididymal cyst 11/28/2018  . Nocturnal polyuria 11/28/2018    History reviewed. No pertinent surgical history.     Home Medications    Prior to Admission medications   Medication Sig Start Date End Date Taking? Authorizing Provider  albuterol (VENTOLIN HFA) 108 (90 Base) MCG/ACT inhaler Inhale 2 puffs into the lungs every 4 (four) hours as needed for wheezing or shortness of breath. 06/02/20  Yes Duell Holdren, Leitha Schuller, NP  nirmatrelvir/ritonavir EUA (PAXLOVID) TABS Patient GFR is 91 (AA) Take nirmatrelvir (150 mg) 2 tablet(s) twice daily for 5 days and ritonavir (100 mg) one tablet twice daily for 5 days. 06/01/20  Yes Brunetta Jeans, PA-C  COVID-19 At Home Antigen Test [facility] COVID-19 HOME TEST) KIT Use as directed 06/01/20   Jefm Bryant, [facility]  ibuprofen (ADVIL) 600 MG tablet Take 1 tablet (600 mg total) by mouth every 6 (six) hours as needed. 03/09/19   Melynda Ripple, MD  mirabegron ER  (MYRBETRIQ) 50 MG TB24 tablet TAKE 1 TABLET BY MOUTH ONCE DAILY Patient not taking: Reported on 06/02/2020 03/31/20 03/31/21  Hollace Hayward, NP  MYRBETRIQ 50 MG TB24 tablet Take 50 mg by mouth daily. Patient not taking: Reported on 06/02/2020 02/19/20   [provider]  naproxen (NAPROSYN) 500 MG tablet Take 1 tablet (500 mg total) by mouth 2 (two) times daily with a meal. 05/30/20   Jaynee Eagles, PA-C  tretinoin (RETIN-A) 0.025 % cream APPLY DIME-SIZE AMOUNT TO ENTIRE FACE NIGHTLY AS TOLERATED 03/31/20 03/31/21  Ulla Gallo, MD    Family History Family History  Problem Relation Age of Onset  . Hypertension Mother   . Hypertension Father     Social History Social History   Tobacco Use  . Smoking status: Never Smoker  . Smokeless tobacco: Never Used  Vaping Use  . Vaping Use: Never used  Substance Use Topics  . Alcohol use: No  . Drug use: No     Allergies   Bactrim [sulfamethoxazole-trimethoprim]   Review of Systems Review of Systems  Constitutional: Positive for chills and fatigue. Negative for activity change, appetite change, diaphoresis, fever and unexpected weight change.  HENT: Positive for congestion, rhinorrhea and sore throat. Negative for dental problem, drooling, ear discharge, ear pain, facial swelling, hearing loss, mouth sores, nosebleeds, postnasal drip, sinus pressure, sinus pain, sneezing, tinnitus, trouble swallowing and voice change.   Eyes: Negative.   Respiratory: Positive for cough and shortness of breath. Negative for  apnea, choking, chest tightness, wheezing and stridor.   Cardiovascular: Negative.   Gastrointestinal: Negative.   Genitourinary: Negative.   Musculoskeletal: Negative.   Skin: Negative.   Neurological: Positive for headaches. Negative for dizziness, tremors, seizures, syncope, facial asymmetry, speech difficulty, weakness, light-headedness and numbness.  Psychiatric/Behavioral: Negative.      Physical Exam Triage Vital  Signs ED Triage Vitals  Enc Vitals Group     BP 06/02/20 0854 127/77     Pulse Rate 06/02/20 0854 85     Resp 06/02/20 0854 18     Temp 06/02/20 0854 98.9 F (37.2 C)     Temp Source 06/02/20 0854 Oral     SpO2 06/02/20 0830 97 %     Weight --      Height --      Head Circumference --      Peak Flow --      Pain Score 06/02/20 0850 4     Pain Loc --      Pain Edu? --      Excl. in [place]? --    No data found.  Updated Vital Signs BP 127/77 (BP Location: Right Arm)   Pulse 85   Temp 98.9 F (37.2 C) (Oral)   Resp 18   SpO2 96%   Visual Acuity Right Eye Distance:   Left Eye Distance:   Bilateral Distance:    Right Eye Near:   Left Eye Near:    Bilateral Near:     Physical Exam Constitutional:      Appearance: Normal appearance. He is normal weight.  HENT:     Head: Normocephalic.     Nose: Congestion and rhinorrhea present.     Mouth/Throat:     Mouth: Mucous membranes are moist.  Eyes:     Extraocular Movements: Extraocular movements intact.  Cardiovascular:     Rate and Rhythm: Normal rate and regular rhythm.     Pulses: Normal pulses.     Heart sounds: Normal heart sounds.  Pulmonary:     Effort: Pulmonary effort is normal.     Breath sounds: Normal breath sounds.  Musculoskeletal:        General: Normal range of motion.  Skin:    General: Skin is warm and dry.  Neurological:     General: No focal deficit present.     Mental Status: He is alert and oriented to person, place, and time. Mental status is at baseline.  Psychiatric:        Mood and Affect: Mood normal.        Behavior: Behavior normal.        Thought Content: Thought content normal.        Judgment: Judgment normal.      UC Treatments / Results  Labs (all labs ordered are listed, but only abnormal results are displayed) Labs Reviewed - No data to display  EKG   Radiology No results found.  Procedures Procedures (including critical care time)  Medications Ordered in  UC Medications - No data to display  Initial Impression / Assessment and Plan / UC Course  I have reviewed the triage vital signs and the nursing notes.  Pertinent labs & imaging results that were available during my care of the patient were reviewed by me and considered in my medical decision making (see chart for details).  Covid 19  1. Oxygen saturation 97% room air, discussed normal value for 02 saturation, discussed sleeping on side, stomach or prompted on pillows  to help lung expansion, discouraged sleeping on back, verbalized understanding 2. Albuterol inhaler 2 puffs every 4 hours prn 3. Chest x-ray- negative,  not medically necessary, requested by patient, verbalized understanding  Final Clinical Impressions(s) / UC Diagnoses   Final diagnoses:  COVID-19     Discharge Instructions     Oxygen saturations normal value- above 90%   Attempt to sleep on side or stomach to help lung expansion while resting  Can use inhaler 2 puffs every 4 hours as needed when feeling short of breath  Can use any over the counter cough medication that provides you relief    ED Prescriptions    Medication Sig Dispense Auth. Provider   albuterol (VENTOLIN HFA) 108 (90 Base) MCG/ACT inhaler Inhale 2 puffs into the lungs every 4 (four) hours as needed for wheezing or shortness of breath. 18 g Hans Eden, NP     PDMP not reviewed this encounter.   Hans Eden, NP 06/02/20 0914    Hans Eden, NP 06/02/20 [phone]

## 2020-06-02 NOTE — ED Triage Notes (Signed)
Tested positive for covid on 05/30/2020.  Patient is concerned for oxygen saturation readings of 94% and 95%.  Insurance nurse told patient to come in to be checked.

## 2020-06-02 NOTE — ED Notes (Signed)
On discharge, patient asked about chest xray.  Notified Vincente Liberty, NP .

## 2020-06-02 NOTE — Discharge Instructions (Addendum)
Oxygen saturations normal value- above 90%   Attempt to sleep on side or stomach to help lung expansion while resting  Can use inhaler 2 puffs every 4 hours as needed when feeling short of breath  Can use any over the counter cough medication that provides you relief

## 2020-06-03 ENCOUNTER — Ambulatory Visit (HOSPITAL_COMMUNITY): Payer: Self-pay

## 2020-06-19 ENCOUNTER — Encounter: Payer: Self-pay | Admitting: Gastroenterology

## 2020-06-22 ENCOUNTER — Other Ambulatory Visit (HOSPITAL_COMMUNITY): Payer: Self-pay

## 2020-06-22 ENCOUNTER — Encounter: Payer: Self-pay | Admitting: Family Medicine

## 2020-06-22 MED FILL — Mirabegron Tab ER 24 HR 50 MG: ORAL | 30 days supply | Qty: 30 | Fill #0 | Status: AC

## 2020-06-23 DIAGNOSIS — M542 Cervicalgia: Secondary | ICD-10-CM | POA: Diagnosis not present

## 2020-06-25 ENCOUNTER — Encounter (HOSPITAL_COMMUNITY): Payer: Self-pay | Admitting: Emergency Medicine

## 2020-06-25 ENCOUNTER — Other Ambulatory Visit: Payer: Self-pay

## 2020-06-25 ENCOUNTER — Ambulatory Visit (HOSPITAL_COMMUNITY)
Admission: EM | Admit: 2020-06-25 | Discharge: 2020-06-25 | Disposition: A | Discharge status: Home / Self Care | Payer: 59 | Attending: Physician Assistant | Admitting: Physician Assistant

## 2020-06-25 DIAGNOSIS — R6881 Early satiety: Secondary | ICD-10-CM | POA: Diagnosis not present

## 2020-06-25 DIAGNOSIS — R5383 Other fatigue: Secondary | ICD-10-CM | POA: Diagnosis not present

## 2020-06-25 DIAGNOSIS — R112 Nausea with vomiting, unspecified: Secondary | ICD-10-CM

## 2020-06-25 DIAGNOSIS — R634 Abnormal weight loss: Secondary | ICD-10-CM

## 2020-06-25 DIAGNOSIS — R8290 Unspecified abnormal findings in urine: Secondary | ICD-10-CM

## 2020-06-25 DIAGNOSIS — R5381 Other malaise: Secondary | ICD-10-CM

## 2020-06-25 LAB — POCT URINALYSIS DIPSTICK, ED / UC
Bilirubin Urine: NEGATIVE
Glucose, UA: NEGATIVE mg/dL
Ketones, ur: NEGATIVE mg/dL
Leukocytes,Ua: NEGATIVE
Nitrite: NEGATIVE
Protein, ur: NEGATIVE mg/dL
Specific Gravity, Urine: 1.01 (ref 1.005–1.030)
Urobilinogen, UA: 0.2 mg/dL (ref 0.0–1.0)
pH: 7 (ref 5.0–8.0)

## 2020-06-25 LAB — COMPREHENSIVE METABOLIC PANEL
ALT: 16 U/L (ref 0–44)
AST: 16 U/L (ref 15–41)
Albumin: 4.2 g/dL (ref 3.5–5.0)
Alkaline Phosphatase: 48 U/L (ref 38–126)
Anion gap: 5 (ref 5–15)
BUN: 9 mg/dL (ref 6–20)
CO2: 29 mmol/L (ref 22–32)
Calcium: 9.3 mg/dL (ref 8.9–10.3)
Chloride: 102 mmol/L (ref 98–111)
Creatinine, Ser: 1.16 mg/dL (ref 0.61–1.24)
GFR, Estimated: >60 mL/min (ref >60)
Glucose, Bld: 82 mg/dL (ref 70–99)
Potassium: 3.6 mmol/L (ref 3.5–5.1)
Sodium: 136 mmol/L (ref 135–145)
Total Bilirubin: 1.4 mg/dL — ABNORMAL HIGH (ref 0.3–1.2)
Total Protein: 7.5 g/dL (ref 6.5–8.1)

## 2020-06-25 LAB — CBC WITH DIFFERENTIAL/PLATELET
Abs Immature Granulocytes: 0 10*3/uL (ref 0.00–0.07)
Basophils Absolute: 0 10*3/uL (ref 0.0–0.1)
Basophils Relative: 1 %
Eosinophils Absolute: 0.1 10*3/uL (ref 0.0–0.5)
Eosinophils Relative: 2 %
HCT: 41.9 % (ref 39.0–52.0)
Hemoglobin: 14.7 g/dL (ref 13.0–17.0)
Immature Granulocytes: 0 %
Lymphocytes Relative: 47 %
Lymphs Abs: 1.2 10*3/uL (ref 0.7–4.0)
MCH: 31.9 pg (ref 26.0–34.0)
MCHC: 35.1 g/dL (ref 30.0–36.0)
MCV: 90.9 fL (ref 80.0–100.0)
Monocytes Absolute: 0.4 10*3/uL (ref 0.1–1.0)
Monocytes Relative: 13 %
Neutro Abs: 1 10*3/uL — ABNORMAL LOW (ref 1.7–7.7)
Neutrophils Relative %: 37 %
Platelets: 196 10*3/uL (ref 150–400)
RBC: 4.61 MIL/uL (ref 4.22–5.81)
RDW: 11.6 % (ref 11.5–15.5)
WBC: 2.6 10*3/uL — ABNORMAL LOW (ref 4.0–10.5)
nRBC: 0 % (ref 0.0–0.2)

## 2020-06-25 LAB — C-REACTIVE PROTEIN: CRP: <0.5 mg/dL (ref <1.0)

## 2020-06-25 LAB — TSH: TSH: 0.76 u[IU]/mL (ref 0.350–4.500)

## 2020-06-25 MED ORDER — ONDANSETRON 4 MG PO TBDP: 4.0000 mg | ORAL_TABLET | Freq: Three times a day (TID) | ORAL | 0 refills | Status: DC | PRN

## 2020-06-25 MED ORDER — PANTOPRAZOLE SODIUM 20 MG PO TBEC: 20.0000 mg | DELAYED_RELEASE_TABLET | Freq: Every day | ORAL | 0 refills | Status: DC

## 2020-06-25 NOTE — ED Provider Notes (Signed)
[place]    CSN: [id number] Arrival date & time: 06/25/20  1547      History   Chief Complaint Chief Complaint  Patient presents with  . Weight Loss    HPI Joseph Santiago is a 35 y.o. male.   Patient presents today with a several month history of unintentional weight loss.  Reports losing approximately 40 pounds over the last year.  He does report eating a healthy diet and exercising regularly but despite a decrease in physical activity level he has continued to lose weight.  He does report decreased appetite and associated fatigue.  Reports often feeling as though he is full or feeling nauseous prior to eating as well as abnormal taste in mouth.  Denies any blood in stool, hematuria, chest pain, shortness of breath.  He denies any recent medication changes.  He denies personal or family history of colon cancer or gastric cancer.  He is scheduled to see a gastroenterologist 07/29/2020 but is hoping to move this appointment forward.  He denies history of gastroparesis or gastric ulcer.  He has not tried any over-the-counter medications for symptom management.  He denies history of thyroid condition but is interested in testing.  He has been under increased rest at work and is often up until 1 AM managing work duties and is unsure if this is related to symptoms.  He denies any recent illness but did have COVID-19 approximately 1 month ago.  Reports that he is feeling much better and does not have any ongoing symptoms.  States current symptoms predate COVID-19 illness.     Past Medical History:  Diagnosis Date  . BMI 30.0-30.9,adult   . Hypertension     Patient Active Problem List   Diagnosis Date Noted  . BMI 30.0-30.9,adult   . Hypertension   . Annual physical exam 07/22/2019  . Hyperlipidemia 02/15/2019  . Anxiety about health 12/29/2018  . Epidermoid cyst 12/26/2018  . Hyperlipemia 11/30/2018  . Epididymal cyst 11/28/2018  . Nocturnal polyuria 11/28/2018     History reviewed. No pertinent surgical history.     Home Medications    Prior to Admission medications   Medication Sig Start Date End Date Taking? Authorizing Provider  ondansetron (ZOFRAN ODT) 4 MG disintegrating tablet Take 1 tablet (4 mg total) by mouth every 8 (eight) hours as needed for nausea or vomiting. 06/25/20  Yes Chalmer Zheng K, PA-C  pantoprazole (PROTONIX) 20 MG tablet Take 1 tablet (20 mg total) by mouth daily. 06/25/20  Yes Clayvon Parlett K, PA-C  albuterol (VENTOLIN HFA) 108 (90 Base) MCG/ACT inhaler Inhale 2 puffs into the lungs every 4 (four) hours as needed for wheezing or shortness of breath. 06/02/20   Hans Eden, NP  COVID-19 At Home Antigen Test [facility] COVID-19 HOME TEST) KIT Use as directed 06/01/20   Jefm Bryant, [facility]  ibuprofen (ADVIL) 600 MG tablet Take 1 tablet (600 mg total) by mouth every 6 (six) hours as needed. 03/09/19   Melynda Ripple, MD  mirabegron ER (MYRBETRIQ) 50 MG TB24 tablet TAKE 1 TABLET BY MOUTH ONCE DAILY Patient not taking: Reported on 06/02/2020 03/31/20 03/31/21  Hollace Hayward, NP  MYRBETRIQ 50 MG TB24 tablet Take 50 mg by mouth daily. Patient not taking: Reported on 06/02/2020 02/19/20   [provider]  naproxen (NAPROSYN) 500 MG tablet Take 1 tablet (500 mg total) by mouth 2 (two) times daily with a meal. 05/30/20   Jaynee Eagles, PA-C  nirmatrelvir/ritonavir EUA (PAXLOVID)  TABS Patient GFR is 91 (AA) Take nirmatrelvir (150 mg) 2 tablet(s) twice daily for 5 days and ritonavir (100 mg) one tablet twice daily for 5 days. 06/01/20   Brunetta Jeans, PA-C  tretinoin (RETIN-A) 0.025 % cream APPLY DIME-SIZE AMOUNT TO ENTIRE FACE NIGHTLY AS TOLERATED 03/31/20 03/31/21  Ulla Gallo, MD    Family History Family History  Problem Relation Age of Onset  . Hypertension Mother   . Hypertension Father     Social History Social History   Tobacco Use  . Smoking status: Never Smoker  . Smokeless tobacco: Never Used  Vaping Use  .  Vaping Use: Never used  Substance Use Topics  . Alcohol use: No  . Drug use: No     Allergies   Bactrim [sulfamethoxazole-trimethoprim]   Review of Systems Review of Systems  Constitutional: Positive for activity change, appetite change, fatigue and unexpected weight change. Negative for fever.  HENT: Negative for congestion, sinus pressure, sneezing and sore throat.   Respiratory: Negative for cough and shortness of breath.   Cardiovascular: Negative for chest pain.  Gastrointestinal: Positive for nausea. Negative for abdominal pain, blood in stool, diarrhea and vomiting.  Neurological: Negative for dizziness, light-headedness and headaches.     Physical Exam Triage Vital Signs ED Triage Vitals [06/25/20 1657]  Enc Vitals Group     BP 117/74     Pulse Rate 70     Resp 16     Temp 97.9 F (36.6 C)     Temp Source Oral     SpO2 100 %     Weight      Height      Head Circumference      Peak Flow      Pain Score 0     Pain Loc      Pain Edu?      Excl. in [place]?    No data found.  Updated Vital Signs BP 117/74 (BP Location: Right Arm)   Pulse 70   Temp 97.9 F (36.6 C) (Oral)   Resp 16   SpO2 100%   Visual Acuity Right Eye Distance:   Left Eye Distance:   Bilateral Distance:    Right Eye Near:   Left Eye Near:    Bilateral Near:     Physical Exam Vitals reviewed.  Constitutional:      General: He is awake.     Appearance: Normal appearance. He is normal weight. He is not ill-appearing.     Comments: Very pleasant male appears stated age in no acute distress  HENT:     Head: Normocephalic and atraumatic.     Right Ear: Tympanic membrane, ear canal and external ear normal. Tympanic membrane is not erythematous or bulging.     Left Ear: Tympanic membrane, ear canal and external ear normal. Tympanic membrane is not erythematous or bulging.     Nose: Nose normal.     Mouth/Throat:     Pharynx: Uvula midline. No oropharyngeal exudate or posterior  oropharyngeal erythema.  Eyes:     Pupils: Pupils are equal, round, and reactive to light.  Cardiovascular:     Rate and Rhythm: Normal rate and regular rhythm.     Heart sounds: No murmur heard.   Pulmonary:     Effort: Pulmonary effort is normal. No accessory muscle usage or respiratory distress.     Breath sounds: Normal breath sounds. No stridor. No wheezing, rhonchi or rales.     Comments: Clear  to auscultation bilaterally Abdominal:     General: Bowel sounds are normal.     Palpations: Abdomen is soft.     Tenderness: There is no abdominal tenderness. There is no right CVA tenderness, left CVA tenderness, guarding or rebound.     Comments: Benign abdominal exam  Lymphadenopathy:     Head:     Right side of head: No submental, submandibular or tonsillar adenopathy.     Left side of head: No submental, submandibular or tonsillar adenopathy.     Cervical: No cervical adenopathy.  Neurological:     Mental Status: He is alert.  Psychiatric:        Behavior: Behavior is cooperative.      UC Treatments / Results  Labs (all labs ordered are listed, but only abnormal results are displayed) Labs Reviewed  POCT URINALYSIS DIPSTICK, ED / UC - Abnormal; Notable for the following components:      Result Value   Hgb urine dipstick TRACE (*)    All other components within normal limits  URINE CULTURE  CBC WITH DIFFERENTIAL/PLATELET  COMPREHENSIVE METABOLIC PANEL  TSH  C-REACTIVE PROTEIN    EKG   Radiology No results found.  Procedures Procedures (including critical care time)  Medications Ordered in UC Medications - No data to display  Initial Impression / Assessment and Plan / UC Course  I have reviewed the triage vital signs and the nursing notes.  Pertinent labs & imaging results that were available during my care of the patient were reviewed by me and considered in my medical decision making (see chart for details).     Patient requested several labs to be  ordered to investigate symptoms.  Unfortunately, given the limitations of urgent care we were unable to obtain all of these but did order CRP, CBC, CMP, TSH.  UA showed trace hemoglobin and culture was sent off to rule out infection.  Discussed that we will contact patient if antibiotics need to be initiated but will defer initiation at this time.  Concern for H. pylori versus gastritis versus GERD so we will start PPI and patient was instructed to take Protonix on empty stomach.  He was provided prescription for Zofran to be used for acute nausea.  He has a scheduled follow-up with GI and was encouraged to keep this appointment but was also provided contact information for another GI to see if appointment can be moved forward.  He was encouraged to eat a bland diet and drink plenty of fluid.  Discussed the importance of following up with PCP for additional lab work and ongoing management.  Strict return precautions given to which patient expressed understanding.  Final Clinical Impressions(s) / UC Diagnoses   Final diagnoses:  Unintentional weight loss  Early satiety  Nausea and vomiting, intractability of vomiting not specified, unspecified vomiting type  Malaise and fatigue  Abnormal urinalysis     Discharge Instructions     Take Protonix on empty stomach.  This should help with acid in your stomach.  Use Zofran for nausea.  Make sure you eat a bland diet and drink plenty of fluid.  We will be in touch if there is any abnormalities on your lab work.  Please follow-up with PCP as we discussed.  Also follow-up with GI specialist.  If anything changes or worsens please return for reevaluation.    ED Prescriptions    Medication Sig Dispense Auth. Provider   ondansetron (ZOFRAN ODT) 4 MG disintegrating tablet Take 1 tablet (4  mg total) by mouth every 8 (eight) hours as needed for nausea or vomiting. 20 tablet Jkwon Treptow K, PA-C   pantoprazole (PROTONIX) 20 MG tablet Take 1 tablet (20 mg total)  by mouth daily. 30 tablet Vinicio Lynk, Derry Skill, PA-C     PDMP not reviewed this encounter.   Terrilee Croak, PA-C 06/25/20 1751

## 2020-06-25 NOTE — Discharge Instructions (Signed)
Take Protonix on empty stomach.  This should help with acid in your stomach.  Use Zofran for nausea.  Make sure you eat a bland diet and drink plenty of fluid.  We will be in touch if there is any abnormalities on your lab work.  Please follow-up with PCP as we discussed.  Also follow-up with GI specialist.  If anything changes or worsens please return for reevaluation.

## 2020-06-25 NOTE — ED Triage Notes (Signed)
Pt here for weight loss concerns x3 months associated w/weakness and loss of appetite, fatigue  Reports he has lost 40 lbs w/in 1 year.   Also reports he was dx w/ COVID on 3/31  Denies f/v/n/d  A&O x4... NAD.Marland Kitchen. ambulatory

## 2020-06-27 LAB — URINE CULTURE: Culture: <10000 — AB

## 2020-06-30 DIAGNOSIS — M542 Cervicalgia: Secondary | ICD-10-CM | POA: Diagnosis not present

## 2020-07-03 ENCOUNTER — Other Ambulatory Visit (HOSPITAL_COMMUNITY): Payer: Self-pay

## 2020-07-03 DIAGNOSIS — F411 Generalized anxiety disorder: Secondary | ICD-10-CM | POA: Diagnosis not present

## 2020-07-03 DIAGNOSIS — F331 Major depressive disorder, recurrent, moderate: Secondary | ICD-10-CM | POA: Diagnosis not present

## 2020-07-03 MED ORDER — ESCITALOPRAM OXALATE 10 MG PO TABS
10.0000 mg | ORAL_TABLET | Freq: Every morning | ORAL | 0 refills | Status: DC
  Filled 2020-07-03: qty 30, 30d supply, fill #0

## 2020-07-06 ENCOUNTER — Encounter: Payer: Self-pay | Admitting: Family Medicine

## 2020-07-06 ENCOUNTER — Other Ambulatory Visit: Payer: Self-pay

## 2020-07-06 ENCOUNTER — Ambulatory Visit (INDEPENDENT_AMBULATORY_CARE_PROVIDER_SITE_OTHER): Payer: 59 | Admitting: Family Medicine

## 2020-07-06 VITALS — BP 124/66 | Ht 67.0 in | Wt 167.0 lb

## 2020-07-06 DIAGNOSIS — R110 Nausea: Secondary | ICD-10-CM

## 2020-07-06 DIAGNOSIS — R634 Abnormal weight loss: Secondary | ICD-10-CM | POA: Diagnosis not present

## 2020-07-06 DIAGNOSIS — R5383 Other fatigue: Secondary | ICD-10-CM

## 2020-07-06 LAB — POCT URINALYSIS DIP (MANUAL ENTRY)
Bilirubin, UA: NEGATIVE
Blood, UA: NEGATIVE
Glucose, UA: NEGATIVE mg/dL
Ketones, POC UA: NEGATIVE mg/dL
Leukocytes, UA: NEGATIVE
Nitrite, UA: NEGATIVE
Protein Ur, POC: NEGATIVE mg/dL
Spec Grav, UA: 1.015 (ref 1.010–1.025)
Urobilinogen, UA: 0.2 E.U./dL
pH, UA: 6 (ref 5.0–8.0)

## 2020-07-06 NOTE — Patient Instructions (Signed)
Overall--I feel very reassuring by your labs and your history. It sounds like with your eating habits, exercise, and covid recently that it is reasonable for you to be lower in weight than previous.   Try to make sure you are drinking plenty of water and setting timers to stay consistent on eating. Stay active when you can.   Follow up in 1 month and we will recheck labs.   Good sleep habits (sometimes referred to as "sleep hygiene") can help you get a good night's sleep. Some habits that can improve your sleep health:  Be consistent. Go to bed at the same time each night and get up at the same time each morning, including on the weekends Make sure your bedroom is quiet, dark, relaxing, and at a comfortable temperature Remove electronic devices, such as TVs, computers, and smart phones, from the bedroom Avoid large meals, caffeine, and alcohol before bedtime Get some exercise. Being physically active during the day can help you fall asleep more easily at night.

## 2020-07-06 NOTE — Progress Notes (Signed)
SUBJECTIVE:   CHIEF COMPLAINT / HPI: Physical/weight loss   Joseph Santiago is a 35 year old gentleman presenting for physical and to discuss recent unexpected weight loss.  He initially starting a weight loss journey earlier this year.  Start weight of 198lbs and got down to 170 pounds by March 2020 with diet and exercise. He had been working out consistently 4 days/weekly with strength training and cardio. Met with a dietician and completely changed his diet.   Since March, he has been more busy and not eating whole foods as consistently as previous and working out less (usually about 2-4 times/weekly but less time). Despite this, he has continued to lose another 10 pounds which has made him very worried. Now maintaining around 160lbs for the past 3 weeks. He also had COVID in 05/2020 and ate less than usual with that, thinks he may have lost 5-6 lbs when he was sick. 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 Fatigued because of this and poor sleep. He otherwise denies any CP, SOB, lumps/masses palpated, rash, painful joints. No abdominal pain but has had some nausea randomly about once a week. Lifelong non-smoker and does not drink alcohol. Family history of throat/brain/lip cancer in grandparents/great uncle/aunts. Thinks his grandmother's brother may have had colon cancer when he was older. Parents have no cancer.   He recently was evaluated by UC on 4/28 for this concern, had labs collected. TSH, electrolytes, Cr, liver function are normal. Hemoglobin normal, WBC 3.0 without previous CBC to compare. Mild elevation in bilirubin to 1.4. CRP <0.5. Urine culture negative.   [place] Office Visit from 07/06/2020 in [place]  Thoughts that you would be better off dead, or of hurting yourself in some way Not at all  PHQ-9 Total Score 12     PERTINENT  PMH / PSH: hyperlipidemia   OBJECTIVE:   BP 124/66   Ht 5' 7"  (1.702 m)   Wt 167 lb  (75.8 kg)   BMI 26.16 kg/m   General: Alert, NAD HEENT: NCAT, MMM neck supple without lymphadenopathy. No supraclavicular lymphadenopathy.  Cardiac: RRR no m/g/r Lungs: Clear bilaterally, no increased WOB  Abdomen: soft, non-tender, non-distended, normoactive BS, no axillary or inguinal lymphadenopathy.  Msk: Moves all extremities spontaneously, normal gait   Ext: Warm, dry, 2+ distal pulses, no edema   ASSESSMENT/PLAN:   Weight loss Recent weight loss journey, however lost an additional 10 lbs despite not being able to follow a stricter diet and exercise as previous. Well appearing with reassuring labs. No lymphadenopathy or localizing symptoms. Overall, low concern for underlying malignancy or autoimmune disorder. Provided reassurance to patient and went over recent labs, suspect likely multifactorial due to COVID, improved metabolism/adipose to muscle ratio, and possible calorie deficiency with long hours at work. Follow up in 1 month to recheck weigh and lifestyle, can recheck CMP/CBC at that time to monitor WBC/bilirubin.   Fatigue Recently seen by psychiatry and recommended lexapro to help with associated fatigue/poor sleep in relation to increased stress with work. Reports he does not feel down or depressed and has not started the medication, finishes fellowship next month which he is hopeful may help with everything. Discussed it is reasonable to hold off and continue support with friends and family. May discuss on follow up if he is interested in trial.   Nausea Intermittently, about once weekly without association. Some epigastric discomfort on occasion. Awaiting GI evaluation in 07/2020, will check h  pylori today to rule this out.     F/u in 1 month for above concerns or sooner if needed.   Patriciaann Clan, [place]

## 2020-07-07 ENCOUNTER — Encounter: Payer: 59 | Admitting: Student in an Organized Health Care Education/Training Program

## 2020-07-07 DIAGNOSIS — M542 Cervicalgia: Secondary | ICD-10-CM | POA: Diagnosis not present

## 2020-07-08 ENCOUNTER — Encounter: Payer: Self-pay | Admitting: Family Medicine

## 2020-07-08 DIAGNOSIS — R5383 Other fatigue: Secondary | ICD-10-CM | POA: Insufficient documentation

## 2020-07-08 DIAGNOSIS — R110 Nausea: Secondary | ICD-10-CM | POA: Insufficient documentation

## 2020-07-08 DIAGNOSIS — R634 Abnormal weight loss: Secondary | ICD-10-CM | POA: Insufficient documentation

## 2020-07-08 LAB — H. PYLORI BREATH TEST: H pylori Breath Test: NEGATIVE

## 2020-07-08 NOTE — Assessment & Plan Note (Signed)
Recently seen by psychiatry and recommended lexapro to help with associated fatigue/poor sleep in relation to increased stress with work. Reports he does not feel down or depressed and has not started the medication, finishes fellowship next month which he is hopeful may help with everything. Discussed it is reasonable to hold off and continue support with friends and family. May discuss on follow up if he is interested in trial.

## 2020-07-08 NOTE — Assessment & Plan Note (Signed)
Recent weight loss journey, however lost an additional 10 lbs despite not being able to follow a stricter diet and exercise as previous. Well appearing with reassuring labs. No lymphadenopathy or localizing symptoms. Overall, low concern for underlying malignancy or autoimmune disorder. Provided reassurance to patient and went over recent labs, suspect likely multifactorial due to COVID, improved metabolism/adipose to muscle ratio, and possible calorie deficiency with long hours at work. Follow up in 1 month to recheck weigh and lifestyle, can recheck CMP/CBC at that time to monitor WBC/bilirubin.

## 2020-07-08 NOTE — Assessment & Plan Note (Signed)
Intermittently, about once weekly without association. Some epigastric discomfort on occasion. Awaiting GI evaluation in 07/2020, will check h pylori today to rule this out.

## 2020-07-21 DIAGNOSIS — M542 Cervicalgia: Secondary | ICD-10-CM | POA: Diagnosis not present

## 2020-07-28 ENCOUNTER — Other Ambulatory Visit (HOSPITAL_COMMUNITY): Payer: Self-pay

## 2020-07-28 MED ORDER — CARESTART COVID-19 HOME TEST VI KIT
PACK | 0 refills | Status: DC
  Filled 2020-07-28 – 2020-07-29 (×2): qty 4, 4d supply, fill #0

## 2020-07-28 MED FILL — Mirabegron Tab ER 24 HR 50 MG: ORAL | 30 days supply | Qty: 30 | Fill #1 | Status: AC

## 2020-07-29 ENCOUNTER — Encounter: Payer: Self-pay | Admitting: Gastroenterology

## 2020-07-29 ENCOUNTER — Other Ambulatory Visit (HOSPITAL_COMMUNITY): Payer: Self-pay

## 2020-07-29 ENCOUNTER — Ambulatory Visit: Payer: 59 | Admitting: Gastroenterology

## 2020-07-29 VITALS — BP 110/72 | HR 64 | Ht 66.75 in | Wt 170.0 lb

## 2020-07-29 DIAGNOSIS — R196 Halitosis: Secondary | ICD-10-CM

## 2020-07-29 DIAGNOSIS — R110 Nausea: Secondary | ICD-10-CM

## 2020-07-29 NOTE — Progress Notes (Signed)
[facility] Consult Note:  History: Joseph Santiago 07/29/2020  Referring provider: Richarda Osmond, DO  Reason for consult/chief complaint: Nausea (Intermittent episodes of Nausea but rarely vomiting. Patient states he has to put gum in his mouth to make him not get sick. ) and odor to breath (Patient was told by his dentist to make an appt with GI because it could be reflux. Pt denies any reflux symptoms. )   Subjective  HPI:  This is a very pleasant 35 year old man referred by primary care for bad breath and intermittent nausea. For about 15 years he has had episodes on average once a week where he will feel a bad taste in the back of his throat and becomes nauseated.  If he does not have gum or mint he might vomit.  He can occur anytime of day, not necessarily after meals, he denies dysphagia, odynophagia or abdominal pain. He was more interested in evaluation for bad breath that has gone on for the last few months.  It persist despite good oral and dental care, and he recently went to the dentist for evaluation.  They told him he was in good oral health and he might have reflux.  The problem persist despite frequent toothbrushing  and use of mouthwash He denies heartburn and is never been on acid suppression. Bowel habits are regular without rectal bleeding He went to a nutritionist and made diet changes and lost 40 pounds by his reckoning, more than he expected to.  He stopped losing weight a few weeks ago.   ROS:  Review of Systems  Constitutional: Negative for appetite change and unexpected weight change.  HENT: Negative for mouth sores and voice change.   Eyes: Negative for pain and redness.  Respiratory: Negative for cough and shortness of breath.   Cardiovascular: Negative for chest pain and palpitations.  Genitourinary: Negative for dysuria and hematuria.  Musculoskeletal: Negative for arthralgias and myalgias.  Skin: Negative for pallor and rash.   Neurological: Negative for weakness and headaches.  Hematological: Negative for adenopathy.     Past Medical History: Past Medical History:  Diagnosis Date  . BMI 30.0-30.9,adult   . Hypertension      Past Surgical History: Past Surgical History:  Procedure Laterality Date  . WISDOM TOOTH EXTRACTION       Family History: Family History  Problem Relation Age of Onset  . Hypertension Mother   . Hypertension Father     Social History: Social History   Socioeconomic History  . Marital status: Single    Spouse name: Not on file  . Number of children: Not on file  . Years of education: Not on file  . Highest education level: Not on file  Occupational History    Employer: [employer]  Tobacco Use  . Smoking status: Never Smoker  . Smokeless tobacco: Never Used  Vaping Use  . Vaping Use: Never used  Substance and Sexual Activity  . Alcohol use: No  . Drug use: No  . Sexual activity: Not on file  Other Topics Concern  . Not on file  Social History Narrative  . Not on file   Social Determinants of Health   Financial Resource Strain: Not on file  Food Insecurity: Not on file  Transportation Needs: Not on file  Physical Activity: Not on file  Stress: Not on file  Social Connections: Not on file   Got his MBA, is just finishing a fellowship with [employer] and might  stay on full-time  Allergies: Allergies  Allergen Reactions  . Bactrim [Sulfamethoxazole-Trimethoprim] Hives and Swelling    Throat closes up    Outpatient Meds: Current Outpatient Medications  Medication Sig Dispense Refill  . mirabegron ER (MYRBETRIQ) 50 MG TB24 tablet Take 50 mg by mouth daily.    . Omega-3 Fatty Acids (FISH OIL) 1000 MG CAPS Take by mouth.     No current facility-administered medications for this visit.      ___________________________________________________________________ Objective   Exam:  BP 110/72   Pulse 64   Ht 5' 6.75" (1.695 m)   Wt 170 lb (77.1  kg)   BMI 26.83 kg/m  Wt Readings from Last 3 Encounters:  07/29/20 170 lb (77.1 kg)  07/06/20 167 lb (75.8 kg)  03/03/20 173 lb 9.6 oz (78.7 kg)     General: Well-appearing  Eyes: sclera anicteric, no redness  ENT: oral mucosa moist without lesions, no cervical or supraclavicular lymphadenopathy.  Good dentition, no mouth sores  CV: RRR without murmur, S1/S2, no JVD, no peripheral edema  Resp: clear to auscultation bilaterally, normal RR and effort noted  GI: soft, no tenderness, with active bowel sounds. No guarding or palpable organomegaly noted.  Skin; warm and dry, no rash or jaundice noted  Neuro: awake, alert and oriented x 3. Normal gross motor function and fluent speech  Labs:  Negative H pylori breath test last month  CBC Latest Ref Rng & Units 06/25/2020 04/12/2015  WBC 4.0 - 10.5 K/uL 2.6(L) -  Hemoglobin 13.0 - 17.0 g/dL 14.7 15.0  Hematocrit 39.0 - 52.0 % 41.9 44.0  Platelets 150 - 400 K/uL 196 -   CMP Latest Ref Rng & Units 06/25/2020 02/25/2020 04/12/2015  Glucose 70 - 99 mg/dL 82 84 78  BUN 6 - 20 mg/dL 9 12 17   Creatinine 0.61 - 1.24 mg/dL 1.16 1.21 1.30(H)  Sodium 135 - 145 mmol/L 136 141 142  Potassium 3.5 - 5.1 mmol/L 3.6 4.1 3.9  Chloride 98 - 111 mmol/L 102 105 104  CO2 22 - 32 mmol/L 29 25 -  Calcium 8.9 - 10.3 mg/dL 9.3 9.7 -  Total Protein 6.5 - 8.1 g/dL 7.5 7.1 -  Total Bilirubin 0.3 - 1.2 mg/dL 1.4(H) 0.9 -  Alkaline Phos 38 - 126 U/L 48 56 -  AST 15 - 41 U/L 16 24 -  ALT 0 - 44 U/L 16 36 -  Albumin 4.2   Assessment: Encounter Diagnoses  Name Primary?  . Nausea in adult Yes  . Halitosis     I suspect his recent onset halitosis is probably not of an upper digestive cause.  He also does not get heartburn. He has about 15 years of intermittent nausea or what might be regurgitation.  Intermittent symptoms not typical for delayed gastric emptying, and he has no risk factors for that. H. pylori breath test negative.  I offered him an  upper endoscopy to see if there is any visible evidence of reflux, though I do not know if it will be revealing test.  He preferred not to have any invasive test at this point.  A trial of acid suppression is not likely to help him because he does not get heartburn  See me as needed.  Thank you for the courtesy of this consult.  Please call me with any questions or concerns.  Nelida Meuse III  CC: Referring provider noted above

## 2020-07-29 NOTE — Patient Instructions (Signed)
If you are age 33 or older, your body mass index should be between 23-30. Your Body mass index is 26.83 kg/m. If this is out of the aforementioned range listed, please consider follow up with your Primary Care Provider.  If you are age 65 or younger, your body mass index should be between 19-25. Your Body mass index is 26.83 kg/m. If this is out of the aformentioned range listed, please consider follow up with your Primary Care Provider.   __________________________________________________________  The [facility] providers would like to encourage you to use [facility] to communicate with providers for non-urgent requests or questions.  Due to long hold times on the telephone, sending your provider a message by [facility] may be a faster and more efficient way to get a response.  Please allow 48 business hours for a response.  Please remember that this is for non-urgent requests.   Due to recent changes in healthcare laws, you may see the results of your imaging and laboratory studies on MyChart before your provider has had a chance to review them.  We understand that in some cases there may be results that are confusing or concerning to you. Not all laboratory results come back in the same time frame and the provider may be waiting for multiple results in order to interpret others.  Please give [place] 48 hours in order for your provider to thoroughly review all the results before contacting the office for clarification of your results.   It has been recommended to you by your physician that you have a(n) endoscopy  completed. Per your request, we did not schedule the procedure(s) today. Please contact our office at [phone] should you decide to have the procedure completed. You will be scheduled for a pre-visit and procedure at that time.  It was a pleasure to see you today!  Thank you for trusting me with your gastrointestinal care!

## 2020-07-30 DIAGNOSIS — Z03818 Encounter for observation for suspected exposure to other biological agents ruled out: Secondary | ICD-10-CM | POA: Diagnosis not present

## 2020-07-31 DIAGNOSIS — Z20822 Contact with and (suspected) exposure to covid-19: Secondary | ICD-10-CM | POA: Diagnosis not present

## 2020-07-31 DIAGNOSIS — Z03818 Encounter for observation for suspected exposure to other biological agents ruled out: Secondary | ICD-10-CM | POA: Diagnosis not present

## 2020-08-12 DIAGNOSIS — M542 Cervicalgia: Secondary | ICD-10-CM | POA: Diagnosis not present

## 2020-08-15 DIAGNOSIS — Z03818 Encounter for observation for suspected exposure to other biological agents ruled out: Secondary | ICD-10-CM | POA: Diagnosis not present

## 2020-08-24 DIAGNOSIS — Z03818 Encounter for observation for suspected exposure to other biological agents ruled out: Secondary | ICD-10-CM | POA: Diagnosis not present

## 2020-09-02 DIAGNOSIS — R531 Weakness: Secondary | ICD-10-CM | POA: Diagnosis not present

## 2020-09-02 DIAGNOSIS — M79605 Pain in left leg: Secondary | ICD-10-CM | POA: Diagnosis not present

## 2020-09-03 ENCOUNTER — Other Ambulatory Visit (HOSPITAL_COMMUNITY): Payer: Self-pay

## 2020-09-03 ENCOUNTER — Other Ambulatory Visit: Payer: Self-pay

## 2020-09-04 ENCOUNTER — Ambulatory Visit: Payer: 59 | Admitting: Family

## 2020-09-09 ENCOUNTER — Other Ambulatory Visit (HOSPITAL_COMMUNITY): Payer: Self-pay

## 2020-09-09 ENCOUNTER — Other Ambulatory Visit: Payer: Self-pay | Admitting: Family Medicine

## 2020-09-09 DIAGNOSIS — M79605 Pain in left leg: Secondary | ICD-10-CM | POA: Diagnosis not present

## 2020-09-09 DIAGNOSIS — R531 Weakness: Secondary | ICD-10-CM | POA: Diagnosis not present

## 2020-09-10 ENCOUNTER — Other Ambulatory Visit (HOSPITAL_COMMUNITY): Payer: Self-pay

## 2020-09-10 ENCOUNTER — Other Ambulatory Visit: Payer: Self-pay | Admitting: Family Medicine

## 2020-09-10 MED ORDER — MIRABEGRON ER 50 MG PO TB24
50.0000 mg | ORAL_TABLET | Freq: Every day | ORAL | 1 refills | Status: DC
  Filled 2020-09-10 – 2020-09-23 (×2): qty 30, 30d supply, fill #0
  Filled 2020-09-29: qty 30, 30d supply, fill #1

## 2020-09-18 ENCOUNTER — Other Ambulatory Visit (HOSPITAL_COMMUNITY): Payer: Self-pay

## 2020-09-23 ENCOUNTER — Other Ambulatory Visit (HOSPITAL_COMMUNITY): Payer: Self-pay

## 2020-09-23 DIAGNOSIS — R531 Weakness: Secondary | ICD-10-CM | POA: Diagnosis not present

## 2020-09-23 DIAGNOSIS — M79605 Pain in left leg: Secondary | ICD-10-CM | POA: Diagnosis not present

## 2020-09-27 DIAGNOSIS — Z03818 Encounter for observation for suspected exposure to other biological agents ruled out: Secondary | ICD-10-CM | POA: Diagnosis not present

## 2020-09-29 ENCOUNTER — Other Ambulatory Visit (HOSPITAL_COMMUNITY): Payer: Self-pay

## 2020-09-29 DIAGNOSIS — R531 Weakness: Secondary | ICD-10-CM | POA: Diagnosis not present

## 2020-09-29 DIAGNOSIS — M79605 Pain in left leg: Secondary | ICD-10-CM | POA: Diagnosis not present

## 2020-10-06 DIAGNOSIS — M79605 Pain in left leg: Secondary | ICD-10-CM | POA: Diagnosis not present

## 2020-10-06 DIAGNOSIS — R531 Weakness: Secondary | ICD-10-CM | POA: Diagnosis not present

## 2020-10-15 DIAGNOSIS — R531 Weakness: Secondary | ICD-10-CM | POA: Diagnosis not present

## 2020-10-15 DIAGNOSIS — M79605 Pain in left leg: Secondary | ICD-10-CM | POA: Diagnosis not present

## 2020-10-20 DIAGNOSIS — M79605 Pain in left leg: Secondary | ICD-10-CM | POA: Diagnosis not present

## 2020-10-20 DIAGNOSIS — R531 Weakness: Secondary | ICD-10-CM | POA: Diagnosis not present

## 2020-10-30 ENCOUNTER — Other Ambulatory Visit: Payer: Self-pay

## 2020-10-30 ENCOUNTER — Other Ambulatory Visit (HOSPITAL_COMMUNITY): Payer: Self-pay

## 2020-10-30 MED FILL — Tretinoin Cream 0.025%: CUTANEOUS | 30 days supply | Qty: 45 | Fill #0 | Status: AC

## 2020-11-02 DIAGNOSIS — U071 COVID-19: Secondary | ICD-10-CM | POA: Diagnosis not present

## 2020-11-03 ENCOUNTER — Other Ambulatory Visit (HOSPITAL_COMMUNITY): Payer: Self-pay

## 2020-11-03 MED ORDER — TRETINOIN 0.025 % EX CREA
1.0000 "application " | TOPICAL_CREAM | Freq: Every evening | CUTANEOUS | 2 refills | Status: DC
  Filled 2020-11-03 – 2021-09-20 (×4): qty 45, 30d supply, fill #0

## 2020-11-04 ENCOUNTER — Other Ambulatory Visit (HOSPITAL_COMMUNITY): Payer: Self-pay

## 2020-11-04 ENCOUNTER — Other Ambulatory Visit: Payer: Self-pay | Admitting: Family Medicine

## 2020-11-04 MED ORDER — MIRABEGRON ER 50 MG PO TB24
50.0000 mg | ORAL_TABLET | Freq: Every day | ORAL | 1 refills | Status: DC
  Filled 2020-11-04 – 2020-11-06 (×4): qty 30, 30d supply, fill #0

## 2020-11-04 MED ORDER — QUICKVUE AT-HOME COVID-19 TEST VI KIT
PACK | 0 refills | Status: DC
  Filled 2020-11-04: qty 2, 2d supply, fill #0

## 2020-11-06 ENCOUNTER — Other Ambulatory Visit (HOSPITAL_COMMUNITY): Payer: Self-pay

## 2020-11-10 DIAGNOSIS — M79605 Pain in left leg: Secondary | ICD-10-CM | POA: Diagnosis not present

## 2020-11-10 DIAGNOSIS — R531 Weakness: Secondary | ICD-10-CM | POA: Diagnosis not present

## 2020-11-12 ENCOUNTER — Other Ambulatory Visit (HOSPITAL_COMMUNITY): Payer: Self-pay

## 2020-11-16 ENCOUNTER — Other Ambulatory Visit (HOSPITAL_COMMUNITY): Payer: Self-pay

## 2020-11-16 DIAGNOSIS — R351 Nocturia: Secondary | ICD-10-CM | POA: Diagnosis not present

## 2020-11-16 DIAGNOSIS — R350 Frequency of micturition: Secondary | ICD-10-CM | POA: Diagnosis not present

## 2020-11-16 MED ORDER — MYRBETRIQ 50 MG PO TB24
50.0000 mg | ORAL_TABLET | Freq: Every day | ORAL | 3 refills | Status: DC
  Filled 2020-11-16: qty 30, 30d supply, fill #0
  Filled 2020-11-16: qty 90, 90d supply, fill #0

## 2020-11-18 DIAGNOSIS — M79605 Pain in left leg: Secondary | ICD-10-CM | POA: Diagnosis not present

## 2020-11-18 DIAGNOSIS — R531 Weakness: Secondary | ICD-10-CM | POA: Diagnosis not present

## 2020-11-19 ENCOUNTER — Other Ambulatory Visit (HOSPITAL_COMMUNITY): Payer: Self-pay

## 2020-11-19 MED ORDER — CARESTART COVID-19 HOME TEST VI KIT
PACK | 0 refills | Status: DC
  Filled 2020-11-19: qty 2, 2d supply, fill #0

## 2020-11-27 DIAGNOSIS — R351 Nocturia: Secondary | ICD-10-CM | POA: Diagnosis not present

## 2020-11-27 DIAGNOSIS — M6289 Other specified disorders of muscle: Secondary | ICD-10-CM | POA: Diagnosis not present

## 2020-11-27 DIAGNOSIS — M6281 Muscle weakness (generalized): Secondary | ICD-10-CM | POA: Diagnosis not present

## 2020-11-27 DIAGNOSIS — R350 Frequency of micturition: Secondary | ICD-10-CM | POA: Diagnosis not present

## 2020-11-27 DIAGNOSIS — M62838 Other muscle spasm: Secondary | ICD-10-CM | POA: Diagnosis not present

## 2020-12-09 DIAGNOSIS — M79605 Pain in left leg: Secondary | ICD-10-CM | POA: Diagnosis not present

## 2020-12-09 DIAGNOSIS — R531 Weakness: Secondary | ICD-10-CM | POA: Diagnosis not present

## 2020-12-15 DIAGNOSIS — M62838 Other muscle spasm: Secondary | ICD-10-CM | POA: Diagnosis not present

## 2020-12-15 DIAGNOSIS — M6281 Muscle weakness (generalized): Secondary | ICD-10-CM | POA: Diagnosis not present

## 2020-12-15 DIAGNOSIS — R350 Frequency of micturition: Secondary | ICD-10-CM | POA: Diagnosis not present

## 2020-12-15 DIAGNOSIS — R351 Nocturia: Secondary | ICD-10-CM | POA: Diagnosis not present

## 2020-12-15 DIAGNOSIS — M6289 Other specified disorders of muscle: Secondary | ICD-10-CM | POA: Diagnosis not present

## 2020-12-22 DIAGNOSIS — M79605 Pain in left leg: Secondary | ICD-10-CM | POA: Diagnosis not present

## 2020-12-22 DIAGNOSIS — R531 Weakness: Secondary | ICD-10-CM | POA: Diagnosis not present

## 2021-01-14 DIAGNOSIS — R531 Weakness: Secondary | ICD-10-CM | POA: Diagnosis not present

## 2021-01-14 DIAGNOSIS — M79605 Pain in left leg: Secondary | ICD-10-CM | POA: Diagnosis not present

## 2021-01-19 DIAGNOSIS — M79605 Pain in left leg: Secondary | ICD-10-CM | POA: Diagnosis not present

## 2021-01-19 DIAGNOSIS — R531 Weakness: Secondary | ICD-10-CM | POA: Diagnosis not present

## 2021-01-26 DIAGNOSIS — M79605 Pain in left leg: Secondary | ICD-10-CM | POA: Diagnosis not present

## 2021-01-26 DIAGNOSIS — R531 Weakness: Secondary | ICD-10-CM | POA: Diagnosis not present

## 2021-02-01 DIAGNOSIS — R350 Frequency of micturition: Secondary | ICD-10-CM | POA: Diagnosis not present

## 2021-02-01 DIAGNOSIS — R3915 Urgency of urination: Secondary | ICD-10-CM | POA: Diagnosis not present

## 2021-02-01 DIAGNOSIS — R351 Nocturia: Secondary | ICD-10-CM | POA: Diagnosis not present

## 2021-02-10 DIAGNOSIS — R531 Weakness: Secondary | ICD-10-CM | POA: Diagnosis not present

## 2021-02-10 DIAGNOSIS — M79605 Pain in left leg: Secondary | ICD-10-CM | POA: Diagnosis not present

## 2021-02-17 ENCOUNTER — Other Ambulatory Visit (HOSPITAL_COMMUNITY): Payer: Self-pay

## 2021-02-17 ENCOUNTER — Ambulatory Visit: Payer: 59 | Admitting: Sports Medicine

## 2021-02-17 ENCOUNTER — Other Ambulatory Visit: Payer: Self-pay

## 2021-02-17 ENCOUNTER — Ambulatory Visit (INDEPENDENT_AMBULATORY_CARE_PROVIDER_SITE_OTHER): Payer: 59

## 2021-02-17 DIAGNOSIS — M2240 Chondromalacia patellae, unspecified knee: Secondary | ICD-10-CM

## 2021-02-17 DIAGNOSIS — M2242 Chondromalacia patellae, left knee: Secondary | ICD-10-CM | POA: Diagnosis not present

## 2021-02-17 DIAGNOSIS — M2241 Chondromalacia patellae, right knee: Secondary | ICD-10-CM

## 2021-02-17 DIAGNOSIS — M25561 Pain in right knee: Secondary | ICD-10-CM | POA: Diagnosis not present

## 2021-02-17 DIAGNOSIS — M25562 Pain in left knee: Secondary | ICD-10-CM | POA: Diagnosis not present

## 2021-02-17 MED ORDER — MELOXICAM 15 MG PO TABS
15.0000 mg | ORAL_TABLET | Freq: Every morning | ORAL | 3 refills | Status: DC
  Filled 2021-02-17 – 2021-02-25 (×2): qty 30, 30d supply, fill #0

## 2021-02-17 NOTE — Progress Notes (Signed)
° ° °  Procedures performed today:    None.  Independent interpretation of notes and tests performed by another provider:   None.  Brief History, Exam, Impression, and Recommendations:    Chondromalacia of patellofemoral joint This is a very pleasant 35 year old male, he has had over a year of pain in both knees, left worse than right, significant retropatellar grinding, popping. On exam I can feel the patellar crepitus, he has excellent quadriceps definition, though his hip abductors are very weak. We discussed hip abductor conditioning, we went over the biomechanics, the anatomy. Will focus on aggressive hip abductor strengthening, adding meloxicam, x-rays, return to see me in 4 weeks.  Chronic process with exacerbation and pharmacologic intervention  ___________________________________________ Gwen Her. Dianah Field, M.D., ABFM., CAQSM. Primary Care and [place] Instructor of [place] [facility]

## 2021-02-17 NOTE — Assessment & Plan Note (Signed)
This is a very pleasant 35 year old male, he has had over a year of pain in both knees, left worse than right, significant retropatellar grinding, popping. On exam I can feel the patellar crepitus, he has excellent quadriceps definition, though his hip abductors are very weak. We discussed hip abductor conditioning, we went over the biomechanics, the anatomy. Will focus on aggressive hip abductor strengthening, adding meloxicam, x-rays, return to see me in 4 weeks.

## 2021-02-17 NOTE — Patient Instructions (Signed)
Hip Rehabilitation Protocol:  1.  Side leg raises.  3x30 with no weight, then 3x15 with 2 lb ankle weight, then 3x15 with 5 lb ankle weight 2.  Side step ups.  3x30 with no weight, then 3x15 with 5 lbs in backpack, then 3x15 with 10 lbs in backpack.

## 2021-02-19 ENCOUNTER — Other Ambulatory Visit (HOSPITAL_COMMUNITY): Payer: Self-pay

## 2021-02-19 MED ORDER — CARESTART COVID-19 HOME TEST VI KIT
PACK | 0 refills | Status: DC
  Filled 2021-02-19 – 2021-02-25 (×2): qty 4, 4d supply, fill #0

## 2021-02-24 DIAGNOSIS — R531 Weakness: Secondary | ICD-10-CM | POA: Diagnosis not present

## 2021-02-24 DIAGNOSIS — M79605 Pain in left leg: Secondary | ICD-10-CM | POA: Diagnosis not present

## 2021-02-25 ENCOUNTER — Other Ambulatory Visit (HOSPITAL_COMMUNITY): Payer: Self-pay

## 2021-03-15 DIAGNOSIS — H52223 Regular astigmatism, bilateral: Secondary | ICD-10-CM | POA: Diagnosis not present

## 2021-03-17 ENCOUNTER — Ambulatory Visit: Payer: 59 | Admitting: Sports Medicine

## 2021-03-22 ENCOUNTER — Encounter: Payer: Self-pay | Admitting: Cardiovascular Disease

## 2021-03-22 ENCOUNTER — Telehealth: Payer: Self-pay | Admitting: Cardiovascular Disease

## 2021-03-22 ENCOUNTER — Other Ambulatory Visit: Payer: Self-pay

## 2021-03-22 ENCOUNTER — Ambulatory Visit: Payer: 59 | Admitting: Sports Medicine

## 2021-03-22 DIAGNOSIS — E785 Hyperlipidemia, unspecified: Secondary | ICD-10-CM

## 2021-03-22 DIAGNOSIS — M2240 Chondromalacia patellae, unspecified knee: Secondary | ICD-10-CM

## 2021-03-22 DIAGNOSIS — E781 Pure hyperglyceridemia: Secondary | ICD-10-CM

## 2021-03-22 NOTE — Telephone Encounter (Signed)
Left a message to call back.  RN to advise pt that my chart message asking for labs prior to appointment was sent to Dr. Acie Fredrickson today.  MD will address at his earliest convenience.

## 2021-03-22 NOTE — Assessment & Plan Note (Signed)
This is a very pleasant 36 year old male, he had a year of pain in both knees, left worse than right, significant retropatellar grinding, popping, he did have some crepitus on exam so we suspected patellofemoral chondromalacia, at the time he had good quadriceps definition but hip abductor's were weak, we discussed conditioning, the biomechanics, his hip abductor's are now significantly stronger, his pain is improved considerably but he still has some crepitus which is to be expected long-term. I given him some anticipatory guidance including avoiding deep knee flexion and squats past 90 degrees, there is also the option of getting Dr. Raeford Razor to build him some custom molded orthotics in the future if needed, otherwise return to see me as needed.

## 2021-03-22 NOTE — Telephone Encounter (Signed)
New Message:      Patient said he would like to have lab work before his appointment on 04-26-21 with Dr Acie Fredrickson please.

## 2021-03-22 NOTE — Progress Notes (Signed)
° ° °  Procedures performed today:    None.  Independent interpretation of notes and tests performed by another provider:   None.  Brief History, Exam, Impression, and Recommendations:    Chondromalacia of patellofemoral joint This is a very pleasant 36 year old male, he had a year of pain in both knees, left worse than right, significant retropatellar grinding, popping, he did have some crepitus on exam so we suspected patellofemoral chondromalacia, at the time he had good quadriceps definition but hip abductor's were weak, we discussed conditioning, the biomechanics, his hip abductor's are now significantly stronger, his pain is improved considerably but he still has some crepitus which is to be expected long-term. I given him some anticipatory guidance including avoiding deep knee flexion and squats past 90 degrees, there is also the option of getting Dr. Raeford Razor to build him some custom molded orthotics in the future if needed, otherwise return to see me as needed.    ___________________________________________ Gwen Her. Dianah Field, M.D., ABFM., CAQSM. Primary Care and [place] Instructor of [place] [facility] Medicine

## 2021-03-22 NOTE — Telephone Encounter (Signed)
Orders placed for FLP, ALT and BMET per MD request.

## 2021-04-05 DIAGNOSIS — M79605 Pain in left leg: Secondary | ICD-10-CM | POA: Diagnosis not present

## 2021-04-05 DIAGNOSIS — R531 Weakness: Secondary | ICD-10-CM | POA: Diagnosis not present

## 2021-04-21 ENCOUNTER — Other Ambulatory Visit: Payer: 59 | Admitting: *Deleted

## 2021-04-21 ENCOUNTER — Other Ambulatory Visit: Payer: Self-pay

## 2021-04-21 DIAGNOSIS — E785 Hyperlipidemia, unspecified: Secondary | ICD-10-CM

## 2021-04-21 DIAGNOSIS — E781 Pure hyperglyceridemia: Secondary | ICD-10-CM | POA: Diagnosis not present

## 2021-04-21 LAB — LIPID PANEL
Chol/HDL Ratio: 2.6 ratio (ref 0.0–5.0)
Cholesterol, Total: 180 mg/dL (ref 100–199)
HDL: 70 mg/dL (ref >39)
LDL Chol Calc (NIH): 100 mg/dL — ABNORMAL HIGH (ref 0–99)
Triglycerides: 53 mg/dL (ref 0–149)
VLDL Cholesterol Cal: 10 mg/dL (ref 5–40)

## 2021-04-21 LAB — BASIC METABOLIC PANEL
BUN/Creatinine Ratio: 10 (ref 9–20)
BUN: 14 mg/dL (ref 6–20)
CO2: 24 mmol/L (ref 20–29)
Calcium: 9.6 mg/dL (ref 8.7–10.2)
Chloride: 104 mmol/L (ref 96–106)
Creatinine, Ser: 1.39 mg/dL — ABNORMAL HIGH (ref 0.76–1.27)
Glucose: 83 mg/dL (ref 70–99)
Potassium: 4 mmol/L (ref 3.5–5.2)
Sodium: 142 mmol/L (ref 134–144)
eGFR: 69 mL/min/{1.73_m2} (ref >59)

## 2021-04-21 LAB — ALT: ALT: 32 IU/L (ref 0–44)

## 2021-04-22 ENCOUNTER — Telehealth: Payer: Self-pay | Admitting: Cardiovascular Disease

## 2021-04-22 NOTE — Telephone Encounter (Signed)
-----   Message from Thayer Headings, MD sent at 04/22/2021 12:22 PM EST ----- His LDL and triglyceride levels are better We will discuss this at his upcoming office next week

## 2021-04-22 NOTE — Telephone Encounter (Signed)
Patient was calling back to receive his results from the nurse. Please call back

## 2021-04-22 NOTE — Telephone Encounter (Signed)
The patient has been notified of the result and verbalized understanding.  All questions (if any) were answered. Nuala Alpha, LPN 03/18/4172 0:81 PM

## 2021-04-26 ENCOUNTER — Other Ambulatory Visit: Payer: Self-pay

## 2021-04-26 ENCOUNTER — Encounter: Payer: Self-pay | Admitting: Cardiovascular Disease

## 2021-04-26 ENCOUNTER — Ambulatory Visit (INDEPENDENT_AMBULATORY_CARE_PROVIDER_SITE_OTHER): Payer: 59 | Admitting: Cardiovascular Disease

## 2021-04-26 VITALS — BP 116/72 | HR 84 | Ht 67.0 in | Wt 173.0 lb

## 2021-04-26 DIAGNOSIS — I10 Essential (primary) hypertension: Secondary | ICD-10-CM | POA: Diagnosis not present

## 2021-04-26 DIAGNOSIS — E781 Pure hyperglyceridemia: Secondary | ICD-10-CM | POA: Diagnosis not present

## 2021-04-26 NOTE — Patient Instructions (Signed)
Medication Instructions:  Continue current medications as listed.  *If you need a refill on your cardiac medications before your next appointment, please call your pharmacy*  Follow-Up: At [facility], you and your health needs are our priority.  As part of our continuing mission to provide you with exceptional heart care, we have created designated Provider Care Teams.  These Care Teams include your primary Cardiologist (physician) and Advanced Practice Providers (APPs -  Physician Assistants and Nurse Practitioners) who all work together to provide you with the care you need, when you need it.  We recommend signing up for the patient portal called "MyChart".  Sign up information is provided on this After Visit Summary.  MyChart is used to connect with patients for Virtual Visits (Telemedicine).  Patients are able to view lab/test results, encounter notes, upcoming appointments, etc.  Non-urgent messages can be sent to your provider as well.   To learn more about what you can do with MyChart, go to [email or web address].    Your next appointment:   2 year(s)  The format for your next appointment:   In Person  Provider:   Mertie Moores, MD {  Thank you for choosing [facility]!!

## 2021-04-26 NOTE — Progress Notes (Signed)
Cardiology Office Note:    Date:  04/26/2021   ID:  Joseph Santiago, DOB [birth year], MRN [id number]  PCP:  Sharion Settler, DO  Cardiologist:  Osby Sweetin  Electrophysiologist:  None   Referring MD: Sharion Settler, DO   Chief Complaint  Patient presents with   Hyperlipidemia    History of Present Illness:    Joseph Santiago is a 36 y.o. male with a hx of HTN, hyperlipidemia   We were asked to see him today by Dr. Ouida Sills for further evaluation of his hx of HTN and hyperlipiemia .    He is in the [employer] program at [employer].  Has a hx of hyperlipidemia and HTN Has had lifestyle changes since being diagnoses with HTN and HLD . His HLD has improved.  BP is now normal.      2 years ago was significant tachypalpitations and was having trouble sleeping.  He had an extensive work-up at [facility].  A limited echocardiogram revealed normal left ventricular size and function.  Ejection fraction was 60 to 65%.  He had trace mitral regurgitation, mild tricuspid regurgitation.  Right ventricular pressures were normal.  CTA of the chest revealed no evidence of pulmonary embolus.  There were no other pulmonary or cardiac concerns seen on the CT scan.  He has improved his diet and exercise / workouts.  No CP or dyspnea  Non smoker Does not drink   Only med is flomax to help prevent nocturia  Does 3  days a week of cardio  And does weight on the same days   + family hx of HTN .  Jan. 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  He will then make a decision about his next step. Has lost 30 lbs since I last saw him Change in his diet, exercise  His mild CKD - has had HTN for years Lipids were reviewed. Lipids are up slightly .  Has a family hx of premature CAD   Feb. 27, 2023: Joseph Santiago is seen for follow up of his HLD and HTN .  Wt is 173.   Now works for [employer] - runs [employer] out 5 days a week .   Cardio / abs / 2 body parts  Lipids  from Feb. 2023 look great    Past Medical History:  Diagnosis Date   BMI 30.0-30.9,adult    Hypertension     Past Surgical History:  Procedure Laterality Date   WISDOM TOOTH EXTRACTION      Current Medications: Current Meds  Medication Sig   Omega-3 Fatty Acids (FISH OIL) 1000 MG CAPS Take by mouth.   [DISCONTINUED] COVID-19 At Home Antigen Test (CARESTART COVID-19 HOME TEST) KIT Use as directed   [DISCONTINUED] COVID-19 At Home Antigen Test (CARESTART COVID-19 HOME TEST) KIT Use as directed   [DISCONTINUED] COVID-19 At Home Antigen Test (CARESTART COVID-19 HOME TEST) KIT Use as directed   [DISCONTINUED] COVID-19 At Home Antigen Test (QUICKVUE AT-HOME COVID-19 TEST) KIT Use as directed.     Allergies:   Bactrim [sulfamethoxazole-trimethoprim], Dust mite extract, and Other   Social History   Socioeconomic History   Marital status: Single    Spouse name: Not on file   Number of children: Not on file   Years of education: Not on file   Highest education level: Not on file  Occupational History  Employer: [employer]  Tobacco Use   Smoking status: Never   Smokeless tobacco: Never  Vaping Use   Vaping Use: Never used  Substance and Sexual Activity   Alcohol use: No   Drug use: No   Sexual activity: Not on file  Other Topics Concern   Not on file  Social History Narrative   Not on file   Social Determinants of Health   Financial Resource Strain: Not on file  Food Insecurity: Not on file  Transportation Needs: Not on file  Physical Activity: Not on file  Stress: Not on file  Social Connections: Not on file     Family History: The patient's family history includes Hypertension in his father and mother.  ROS:   Please see the history of present illness.     All other systems reviewed and are negative.  EKGs/Labs/Other Studies Reviewed:        Recent Labs: 06/25/2020:  Hemoglobin 14.7; Platelets 196; TSH 0.760 04/21/2021: ALT 32; BUN 14; Creatinine, Ser 1.39; Potassium 4.0; Sodium 142  Recent Lipid Panel    Component Value Date/Time   CHOL 180 04/21/2021 0723   TRIG 53 04/21/2021 0723   HDL 70 04/21/2021 0723   CHOLHDL 2.6 04/21/2021 0723   LDLCALC 100 (H) 04/21/2021 0723    Physical Exam:    Physical Exam: Blood pressure 116/72, pulse 84, height 5' 7"  (1.702 m), weight 173 lb (78.5 kg), SpO2 98 %.  GEN:  Well nourished, well developed in no acute distress HEENT: Normal NECK: No JVD; No carotid bruits LYMPHATICS: No lymphadenopathy CARDIAC: RRR , no murmurs, rubs, gallops RESPIRATORY:  Clear to auscultation without rales, wheezing or rhonchi  ABDOMEN: Soft, non-tender, non-distended MUSCULOSKELETAL:  No edema; No deformity  SKIN: Warm and dry NEUROLOGIC:  Alert and oriented x 3   EKG:    Feb. 27, 2023:  NSTR at 59.  No ST or T abn.    ASSESSMENT:    1. Pure hypertriglyceridemia   2. Primary hypertension     PLAN:      History of hypertension:  BP is great.   He has lost weight.   Works out regularly .  Doing well    2.  Hypertriglyceridemia.  Lipids are greatly improved.    Takes fsh oil BID  He would like to follow up in 2 years      Medication Adjustments/Labs and Tests Ordered: Current medicines are reviewed at length with the patient today.  Concerns regarding medicines are outlined above.  Orders Placed This Encounter  Procedures   EKG 12-Lead   No orders of the defined types were placed in this encounter.    Patient Instructions  Medication Instructions:  Continue current medications as listed.  *If you need a refill on your cardiac medications before your next appointment, please call your pharmacy*  Follow-Up: At [facility], you and your health needs are our priority.  As part of our continuing mission to provide you with exceptional heart care, we have created designated Provider Care Teams.  These  Care Teams include your primary Cardiologist (physician) and Advanced Practice Providers (APPs -  Physician Assistants and Nurse Practitioners) who all work together to provide you with the care you need, when you need it.  We recommend signing up for the patient portal called "MyChart".  Sign up information is provided on this After Visit Summary.  MyChart is used to connect with patients for Virtual Visits (Telemedicine).  Patients are able to view  lab/test results, encounter notes, upcoming appointments, etc.  Non-urgent messages can be sent to your provider as well.   To learn more about what you can do with MyChart, go to [email or web address].    Your next appointment:   2 year(s)  The format for your next appointment:   In Person  Provider:   Mertie Moores, MD {  Thank you for choosing [facility]!!      Signed, Mertie Moores, MD  04/26/2021 4:31 PM    [facility]

## 2021-07-01 DIAGNOSIS — F4320 Adjustment disorder, unspecified: Secondary | ICD-10-CM | POA: Diagnosis not present

## 2021-08-03 ENCOUNTER — Ambulatory Visit: Payer: 59 | Admitting: Family Medicine

## 2021-08-03 ENCOUNTER — Encounter: Payer: Self-pay | Admitting: *Deleted

## 2021-08-04 ENCOUNTER — Encounter: Payer: Self-pay | Admitting: Family Medicine

## 2021-08-04 ENCOUNTER — Ambulatory Visit (INDEPENDENT_AMBULATORY_CARE_PROVIDER_SITE_OTHER): Payer: 59 | Admitting: Family Medicine

## 2021-08-04 DIAGNOSIS — Z713 Dietary counseling and surveillance: Secondary | ICD-10-CM

## 2021-08-04 NOTE — Progress Notes (Signed)
Medical Nutrition (MNT) Encounter  PCP Sharion Settler, DO  Appt start time: 1630 end time: 1730 (1 hour)  Reason for MNT visit: Referred by cardiologist Cleatrice Burke, MD for Medical Nutrition Therapy related to hyperlipidemia (E78.5).  Joseph Santiago has a goal weight of ~166 lb.    Relevant history/background: Joseph Santiago was seen for MNT in the fall of 2021 and in early 2022.  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  Used to train and eat specifically for sport  (8th grade through sophomore yr in college).  Family history of cancer, CVD, HTN, and DM.  In addition to HLD, Joseph Santiago has a h/o HTN, which is now controlled with diet and lifestyle.  He has lost 30 lb since Aug 2021, although LDL has increased.   02/25/20 lipids: LDL 125, total cholest 202, HDL 62; TG 82.    Assessment: Joseph Santiago would like a meal plan to help him make good food choices to maintain healthy lipid levels.  He prepares his own food, and shops once or twice a week.  Goals include maintaining healthy lipid panel, muscle mass and strength, and overall health.  Based on diet history, including food recall, it is likely that Joseph Santiago is consuming inadequate energy and inadequate protein.  He will benefit from learning a few culinary skills, and how to streamline the process of planning and preparing foods, which will require some experimentation.    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  Usual eating pattern: 3 meals and 1-2 snacks per day. Frequent foods and beverages: Alkaline water, (occasional diluted juice); soy analogues, fish, brn rice, fruit & veg's,  PB&J sandw's, eggs (daily), instant flavored oatmeal or almond milk w/ Honey Nut Cheerios.   Avoided  foods: most red and processed meat, most dairy, okra, pigs feet, sweets, packaged snack foods.   Usual physical activity: 20 min run, 5 min abs, 60(+) min wt training 5 X wk.  No exercise on Sat and Sun.   Sleep: Estimates he gets 8 hrs per night.  Bedtime 8:30 PM, usually up at 5 AM.   24-hr recall suggests intake of ~1275 kcal:  (Up at 7:30 AM [off work this week]) B (8:15 AM)-  2 1/2 c Honey Nut Cheerios, 1 1/2 c almond milk, 3-4 raspberries, alkaline water  375 Snk ( AM)-  --- L (12 PM)-  1 PB&J (2 tbsp PB, 1 tbsp jam), 1 banana, alk water     500 Snk ( PM)-  --- D ( PM)-  Takeout: 1/2 c hibachi chx w/ 1/2 c veg's, 1/2 c rice, 3 strawberries, alk water  400 Snk ( PM)-  --- Typical day? No. Dinner is more often a veggie burger with egg.  Restaurant or takeout about every 10 days.    Intervention: Completed diet and exercise history, and re-established behavioral goals.  Nutritional Diagnosis: NI-1.4 Inadequate energy intake as related to energy expenditure as evidenced by 24-hr recall suggesting intake of <1300 kcal.    For recommendations and goals, see Patient Instructions.    Follow-up in September.  Patient will call to schedule.    Rhythm Wigfall,JEANNIE

## 2021-08-04 NOTE — Patient Instructions (Addendum)
Make use of frozen veg's, which are nutritionally pretty close to fresh.    Check out Dr.Yum website.  https://recipes.doctoryum.org/en/makers/super-soup-maker  If you want to build and maintain your muscle mass, you need to get adequate protein AND energy (calories).  If you don't get enough energy, your body will use protein to meet energy needs.  If you are energy-deficient, your protein needs actually increase.   Protein recommendation: At a weight of 170, a protein goal of ~70 grams/day is realistic.    Shopping:  - Herbs and spices do not add sodium to your recipe.  When you buy any of these, avoid the products that have "salt" in the name.  You do not want, for example, garlic salt.   - When looking at ingredients, be skeptical of words you don't recognize and can't pronounce! - In general, WHOLE, REAL food, which is food not only YOU recognize, but your grandmother would also recognize, is the goal, avoiding highly processed foods.    Goals: 1. Include a protein food at each meal.       Each 1 ounce of meat, fish, poultry, cheese, each 1 egg = 7 g protein.  1 full cup of most beans = 15 g protein.  Each 8 oz milk or most soy milks = 7-8 g protein.    2. Set aside a few minutes each weekend to plan foods for the upcoming week.    Complete the meal planning form.  Email to [place] for feedback.    Book recommendation:  Eating on the Wild Side by Arrie Eastern.    Follow-up appt prn.

## 2021-09-14 ENCOUNTER — Other Ambulatory Visit (HOSPITAL_COMMUNITY): Payer: Self-pay

## 2021-09-20 ENCOUNTER — Other Ambulatory Visit (HOSPITAL_COMMUNITY): Payer: Self-pay

## 2021-09-20 ENCOUNTER — Telehealth: Payer: Self-pay

## 2021-09-20 ENCOUNTER — Encounter: Payer: Self-pay | Admitting: Family Medicine

## 2021-09-20 ENCOUNTER — Ambulatory Visit: Payer: 59 | Admitting: Family Medicine

## 2021-09-20 VITALS — BP 122/76 | HR 77 | Temp 96.9°F | Ht 67.0 in | Wt 170.8 lb

## 2021-09-20 DIAGNOSIS — R3581 Nocturnal polyuria: Secondary | ICD-10-CM

## 2021-09-20 DIAGNOSIS — I10 Essential (primary) hypertension: Secondary | ICD-10-CM | POA: Diagnosis not present

## 2021-09-20 DIAGNOSIS — R350 Frequency of micturition: Secondary | ICD-10-CM

## 2021-09-20 DIAGNOSIS — E781 Pure hyperglyceridemia: Secondary | ICD-10-CM | POA: Diagnosis not present

## 2021-09-20 DIAGNOSIS — R7989 Other specified abnormal findings of blood chemistry: Secondary | ICD-10-CM | POA: Diagnosis not present

## 2021-09-20 DIAGNOSIS — Z20822 Contact with and (suspected) exposure to covid-19: Secondary | ICD-10-CM | POA: Diagnosis not present

## 2021-09-20 DIAGNOSIS — Z0000 Encounter for general adult medical examination without abnormal findings: Secondary | ICD-10-CM | POA: Diagnosis not present

## 2021-09-20 DIAGNOSIS — R351 Nocturia: Secondary | ICD-10-CM | POA: Diagnosis not present

## 2021-09-20 DIAGNOSIS — E785 Hyperlipidemia, unspecified: Secondary | ICD-10-CM | POA: Diagnosis not present

## 2021-09-20 LAB — POC SOFIA SARS ANTIGEN FIA: SARS Coronavirus 2 Ag: NEGATIVE

## 2021-09-20 NOTE — Assessment & Plan Note (Signed)
Chronic Slightly worse recently Check UA, GC chlamydia Cannot rule diabetes, will screen with hemoglobin A 1C

## 2021-09-20 NOTE — Assessment & Plan Note (Signed)
6 months ago lipids were well controlled with diet and exercise Recheck lipid, and other restratification labs as ordered above

## 2021-09-20 NOTE — Assessment & Plan Note (Signed)
Well-controlled with lifestyle modifications Did have elevated creatinine at cardiology appointment approximately 6 months ago.  This was presumed to be CKD We will recheck CMP, lipid, hemoglobin A1c, UA

## 2021-09-20 NOTE — Assessment & Plan Note (Signed)
Exposure, but negative test, Recommend return if no improvement, or to go to the emergency department if symptoms worsen

## 2021-09-20 NOTE — Progress Notes (Signed)
Chief Complaint:  Joseph Santiago is a 36 y.o. male who presents today for his annual comprehensive physical exam.    Assessment/Plan:   Problem List Items Addressed This Visit       Cardiovascular and Mediastinum   Hypertension    Well-controlled with lifestyle modifications Did have elevated creatinine at cardiology appointment approximately 6 months ago.  This was presumed to be CKD We will recheck CMP, lipid, hemoglobin A1c, UA      Relevant Orders   Comprehensive metabolic panel   Hemoglobin A1C     Other   Nocturnal polyuria    Chronic Slightly worse recently Check UA, GC chlamydia Cannot rule diabetes, will screen with hemoglobin A 1C       Hyperlipemia    6 months ago lipids were well controlled with diet and exercise Recheck lipid, and other restratification labs as ordered above       Relevant Orders   Lipid panel   Hemoglobin A1C   Hyperlipidemia   Relevant Orders   Lipid panel   Hemoglobin A1C   Annual physical exam   Relevant Orders   Urinalysis, Routine w reflex microscopic   Exposure to COVID-19 virus - Primary    Exposure, but negative test, Recommend return if no improvement, or to go to the emergency department if symptoms worsen      Relevant Orders   POC SOFIA Antigen FIA (Completed)   Other Visit Diagnoses     Urinary frequency       Relevant Orders   Chlamydia/GC NAA, Confirmation   Urinalysis, Routine w reflex microscopic   Hemoglobin A1C   Nocturia       Relevant Orders   Urinalysis, Routine w reflex microscopic   Hemoglobin A1C   Elevated serum creatinine       Relevant Orders   Comprehensive metabolic panel       Body mass index is 26.75 kg/m. /      Patient Counseling(The following topics were reviewed and/or handout was given):  -Nutrition: Stressed importance of moderation in sodium/caffeine intake, saturated fat and cholesterol, caloric balance, sufficient intake of fresh fruits, vegetables, and fiber.   -Stressed the importance of regular exercise.   -Substance Abuse: Discussed cessation/primary prevention of tobacco, alcohol, or other drug use; driving or other dangerous activities under the influence; availability of treatment for abuse.   -Injury prevention: Discussed safety belts, safety helmets, smoke detector, smoking near bedding or upholstery.   -Sexuality: Discussed sexually transmitted diseases, partner selection, use of condoms, avoidance of unintended pregnancy and contraceptive alternatives.   -Dental health: Discussed importance of regular tooth brushing, flossing, and dental visits.  -Health maintenance and immunizations reviewed. Please refer to Health maintenance section.  Return to care in 1 year for next preventative visit.     Subjective:  HPI:  He has concern for possible COVID.  Patient exposure to COVID over the weekend.  This was a party where the infant was positive.  Patient has no symptoms of chest pain, shortness of breath.   Hyperlipidemia, established problem, Stable Diet controlled  ROS: No chest pain or shortness of breath. No myalgias.  Hypertension, established problem, Stable BP Readings from Last 3 Encounters:  09/20/21 122/76  04/26/21 116/72  07/29/20 110/72    Controlled with lifestyle modifications including diet exercise, patient follows with nutrition   ROS: Denies any chest pain, shortness of breath, dyspnea on exertion, leg edema.    Patient has history of urinary frequency and nocturia.  Patient says that this is chronic.  He is followed with urology and been on Flomax.  However this was discontinued due to no change.  Patient did go through pelvic rehab therapy which did help.  Also reports some mild change, says that he will restart home exercises to assist with this.  Denies any hematuria, dysuria, penile discharge.  Patient is sexually active with females.  Does not have any known STI exposure.  Lifestyle Diet:  Balanced Exercise: 5 days a week     09/20/2021    3:22 PM  Depression screen PHQ 2/9  Decreased Interest 0  Down, Depressed, Hopeless 0  PHQ - 2 Score 0    There are no preventive care reminders to display for this patient.     ROS: As per HPI, otherwise all systems reviewed and are negative  PMH:  The following were reviewed and entered/updated in epic: Past Medical History:  Diagnosis Date   BMI 30.0-30.9,adult    Hypertension    Patient Active Problem List   Diagnosis Date Noted   Exposure to COVID-19 virus 09/20/2021   Chondromalacia of patellofemoral joint 02/17/2021   Weight loss 07/08/2020   Fatigue 07/08/2020   Nausea 07/08/2020   BMI 30.0-30.9,adult    Hypertension    Annual physical exam 07/22/2019   Hyperlipidemia 02/15/2019   Anxiety about health 12/29/2018   Epidermoid cyst 12/26/2018   Hyperlipemia 11/30/2018   Epididymal cyst 11/28/2018   Nocturnal polyuria 11/28/2018   Past Surgical History:  Procedure Laterality Date   WISDOM TOOTH EXTRACTION      Family History  Problem Relation Age of Onset   Hypertension Mother    Hypertension Father     Medications- reviewed and updated Current Outpatient Medications  Medication Sig Dispense Refill   Omega-3 Fatty Acids (FISH OIL) 1000 MG CAPS Take by mouth.     tretinoin (RETIN-A) 0.025 % cream Apply a dime-size amount to the entire face as tolerated each night 45 g 2   No current facility-administered medications for this visit.    Allergies-reviewed and updated Allergies  Allergen Reactions   Bactrim [Sulfamethoxazole-Trimethoprim] Hives and Swelling    Throat closes up   Dust Mite Extract    Other     Roaches     Social History   Socioeconomic History   Marital status: Single    Spouse name: Not on file   Number of children: Not on file   Years of education: Not on file   Highest education level: Not on file  Occupational History    Employer: [employer]  Tobacco Use    Smoking status: Never   Smokeless tobacco: Never  Vaping Use   Vaping Use: Never used  Substance and Sexual Activity   Alcohol use: No   Drug use: No   Sexual activity: Yes  Other Topics Concern   Not on file  Social History Narrative   Not on file   Social Determinants of Health   Financial Resource Strain: Not on file  Food Insecurity: Not on file  Transportation Needs: Not on file  Physical Activity: Not on file  Stress: Not on file  Social Connections: Not on file        Objective:  Physical Exam: BP 122/76 (BP Location: Right Arm, Patient Position: Sitting, Cuff Size: Normal)   Pulse 77   Temp (!) 96.9 F (36.1 C) (Temporal)   Ht '5\' 7"'$  (1.702 m)   Wt 170 lb 12.8 oz (77.5 kg)  SpO2 96%   BMI 26.75 kg/m   Body mass index is 26.75 kg/m. Wt Readings from Last 3 Encounters:  09/20/21 170 lb 12.8 oz (77.5 kg)  08/04/21 167 lb 9.6 oz (76 kg)  04/26/21 173 lb (78.5 kg)   Gen: NAD, resting comfortably HEENT: No thyromegaly noted. Hearing grossly intact CV: RRR with no murmurs appreciated Pulm: NWOB, CTAB with no crackles, wheezes, or rhonchi,  GI: Normal bowel sounds present. Soft, Nontender, Nondistended. MSK: no edema, cyanosis, or clubbing noted Skin: warm, dry Neuro: CN2-12 grossly intact. No tremor Psych: Normal affect and thought content     At today's visit, we discussed treatment options, associated risk and benefits, and engage in counseling as needed.  Additionally the following were reviewed: Past medical records, past medical and surgical history, family and social background, as well as relevant laboratory results, imaging findings, and specialty notes, where applicable.  This message was generated using dictation software, and as a result, it may contain unintentional typos or errors.  Nevertheless, extensive effort was made to accurately convey at the pertinent aspects of the patient visit.    There may have been are other unrelated non-urgent  complaints, but due to the busy schedule and the amount of time already spent with him, time does not permit to address these issues at today's visit. Another appointment may have or has been requested to review these additional issues.   Marny Lowenstein, MD, MS

## 2021-09-20 NOTE — Telephone Encounter (Signed)
Left patient a detailed voice message to return call to office regarding lab work. Patient had lab work done today and I wasn't aware he wanted to be fasting. I show he does have an appointment scheduled on 09/21/21 on the lab schedule for 8:20 am for labs.

## 2021-09-20 NOTE — Addendum Note (Signed)
Addended by: Renaee Munda on: 09/20/2021 04:11 PM   Modules accepted: Orders

## 2021-09-21 ENCOUNTER — Other Ambulatory Visit (INDEPENDENT_AMBULATORY_CARE_PROVIDER_SITE_OTHER): Payer: 59

## 2021-09-21 ENCOUNTER — Other Ambulatory Visit: Payer: Self-pay

## 2021-09-21 DIAGNOSIS — Z0000 Encounter for general adult medical examination without abnormal findings: Secondary | ICD-10-CM | POA: Diagnosis not present

## 2021-09-21 LAB — COMPREHENSIVE METABOLIC PANEL
ALT: 16 U/L (ref 0–53)
AST: 20 U/L (ref 0–37)
Albumin: 4.4 g/dL (ref 3.5–5.2)
Alkaline Phosphatase: 49 U/L (ref 39–117)
BUN: 15 mg/dL (ref 6–23)
CO2: 26 mEq/L (ref 19–32)
Calcium: 9.3 mg/dL (ref 8.4–10.5)
Chloride: 105 mEq/L (ref 96–112)
Creatinine, Ser: 1.31 mg/dL (ref 0.40–1.50)
GFR: 71.27 mL/min (ref >60.00)
Glucose, Bld: 80 mg/dL (ref 70–99)
Potassium: 3.7 mEq/L (ref 3.5–5.1)
Sodium: 139 mEq/L (ref 135–145)
Total Bilirubin: 1.1 mg/dL (ref 0.2–1.2)
Total Protein: 7.2 g/dL (ref 6.0–8.3)

## 2021-09-21 LAB — URINALYSIS, ROUTINE W REFLEX MICROSCOPIC
Bilirubin Urine: NEGATIVE
Hgb urine dipstick: NEGATIVE
Ketones, ur: NEGATIVE
Leukocytes,Ua: NEGATIVE
Nitrite: NEGATIVE
RBC / HPF: NONE SEEN (ref 0–?)
Specific Gravity, Urine: 1.015 (ref 1.000–1.030)
Total Protein, Urine: NEGATIVE
Urine Glucose: NEGATIVE
Urobilinogen, UA: 0.2 (ref 0.0–1.0)
pH: 7 (ref 5.0–8.0)

## 2021-09-21 LAB — LIPID PANEL
Cholesterol: 191 mg/dL (ref 0–200)
HDL: 67.4 mg/dL (ref >39.00)
LDL Cholesterol: 112 mg/dL — ABNORMAL HIGH (ref 0–99)
NonHDL: 123.49
Total CHOL/HDL Ratio: 3
Triglycerides: 58 mg/dL (ref 0.0–149.0)
VLDL: 11.6 mg/dL (ref 0.0–40.0)

## 2021-09-21 LAB — HEMOGLOBIN A1C: Hgb A1c MFr Bld: 5.4 % (ref 4.6–6.5)

## 2021-09-21 NOTE — Progress Notes (Signed)
Redrawing patient this morning for fasting labs.

## 2021-09-24 LAB — CHLAMYDIA/GC NAA, CONFIRMATION
Chlamydia trachomatis, NAA: NEGATIVE
Neisseria gonorrhoeae, NAA: NEGATIVE

## 2021-10-11 ENCOUNTER — Other Ambulatory Visit: Payer: Self-pay | Admitting: Family Medicine

## 2021-10-11 ENCOUNTER — Telehealth: Payer: Self-pay | Admitting: Family Medicine

## 2021-10-11 DIAGNOSIS — E782 Mixed hyperlipidemia: Secondary | ICD-10-CM

## 2021-10-11 NOTE — Telephone Encounter (Signed)
Pt is needing his referral for Nutrition put in today 10/11/21 by Dr. Grandville Silos needing to be sent to  Dennard Nip, MD  Address: [address], [place], [place] [address] Phone: [phone]

## 2021-10-11 NOTE — Telephone Encounter (Signed)
Referral moved to Dr. Leafy Ro

## 2021-11-03 ENCOUNTER — Ambulatory Visit
Admission: EM | Admit: 2021-11-03 | Discharge: 2021-11-03 | Disposition: A | Discharge status: Home / Self Care | Payer: 59 | Attending: Family Medicine | Admitting: Family Medicine

## 2021-11-03 DIAGNOSIS — Z20822 Contact with and (suspected) exposure to covid-19: Secondary | ICD-10-CM | POA: Insufficient documentation

## 2021-11-03 DIAGNOSIS — Z9189 Other specified personal risk factors, not elsewhere classified: Secondary | ICD-10-CM | POA: Diagnosis not present

## 2021-11-03 DIAGNOSIS — J069 Acute upper respiratory infection, unspecified: Secondary | ICD-10-CM | POA: Diagnosis not present

## 2021-11-03 LAB — SARS CORONAVIRUS 2 BY RT PCR: SARS Coronavirus 2 by RT PCR: NEGATIVE

## 2021-11-03 MED ORDER — AMOXICILLIN 875 MG PO TABS: 875.0000 mg | ORAL_TABLET | Freq: Two times a day (BID) | ORAL | 0 refills | Status: DC

## 2021-11-03 NOTE — ED Provider Notes (Signed)
[name]    CSN: [id number] Arrival date & time: 11/03/21  1502      History   Chief Complaint Chief Complaint  Patient presents with   Sore Throat   Nasal Congestion   Fatigue    HPI Joseph Santiago is a 36 y.o. male.   Patient complains of four day history of typical cold-like symptoms developing over several days, including mild sore throat, sinus congestion, headache, fatigue, sneezing, myalgias, and cough. He has had chills but not fever.  He denies pleuritic pain and shortness of breath.  He has a history of seasonal rhinitis (spring) and sinusitis.  He has had two negative home COVID tests, 4 and 3 days ago.  The history is provided by the patient.    Past Medical History:  Diagnosis Date   BMI 30.0-30.9,adult    Hypertension     Patient Active Problem List   Diagnosis Date Noted   Exposure to COVID-19 virus 09/20/2021   Chondromalacia of patellofemoral joint 02/17/2021   Weight loss 07/08/2020   Fatigue 07/08/2020   Nausea 07/08/2020   BMI 30.0-30.9,adult    Hypertension    Annual physical exam 07/22/2019   Hyperlipidemia 02/15/2019   Anxiety about health 12/29/2018   Epidermoid cyst 12/26/2018   Hyperlipemia 11/30/2018   Epididymal cyst 11/28/2018   Nocturnal polyuria 11/28/2018    Past Surgical History:  Procedure Laterality Date   WISDOM TOOTH EXTRACTION         Home Medications    Prior to Admission medications   Medication Sig Start Date End Date Taking? Authorizing Provider  amoxicillin (AMOXIL) 875 MG tablet Take 1 tablet (875 mg total) by mouth 2 (two) times daily. 11/03/21  Yes Kandra Nicolas, MD  Omega-3 Fatty Acids (FISH OIL) 1000 MG CAPS Take by mouth.    [provider]  tretinoin (RETIN-A) 0.025 % cream Apply a dime-size amount to the entire face as tolerated each night 11/03/20       Family History Family History  Problem Relation Age of Onset   Hypertension Mother    Hypertension Father     Social  History Social History   Tobacco Use   Smoking status: Never   Smokeless tobacco: Never  Vaping Use   Vaping Use: Never used  Substance Use Topics   Alcohol use: No   Drug use: No     Allergies   Bactrim [sulfamethoxazole-trimethoprim], Dust mite extract, and Other   Review of Systems Review of Systems + sore throat + cough + sneezing No pleuritic pain No wheezing + nasal congestion + post-nasal drainage + sinus pain/pressure No itchy/red eyes No earache No hemoptysis No SOB No fever, + chills No nausea No vomiting No abdominal pain No diarrhea No urinary symptoms No skin rash + fatigue + myalgias + headache Used OTC meds (Mucinex) without relief   Physical Exam Triage Vital Signs ED Triage Vitals  Enc Vitals Group     BP 11/03/21 1524 (!) 139/92     Pulse Rate 11/03/21 1524 66     Resp 11/03/21 1524 18     Temp 11/03/21 1524 99.2 F (37.3 C)     Temp Source 11/03/21 1524 Oral     SpO2 11/03/21 1524 98 %     Weight --      Height --      Head Circumference --      Peak Flow --      Pain Score 11/03/21 1525 6  Pain Loc --      Pain Edu? --      Excl. in [place]? --    No data found.  Updated Vital Signs BP (!) 139/92 (BP Location: Left Arm)   Pulse 66   Temp 99.2 F (37.3 C) (Oral)   Resp 18   SpO2 98%   Visual Acuity Right Eye Distance:   Left Eye Distance:   Bilateral Distance:    Right Eye Near:   Left Eye Near:    Bilateral Near:     Physical Exam Nursing notes and Vital Signs reviewed. Appearance:  Patient appears stated age, and in no acute distress Eyes:  Pupils are equal, round, and reactive to light and accomodation.  Extraocular movement is intact.  Conjunctivae are not inflamed  Ears:  Canals normal.  Right tympanic membrane normal; left tympanic membrane slightly erythematous. Nose:   Congested turbinates, more pronounced on left.  No sinus tenderness.   Pharynx:  Normal Neck:  Supple.  Mildly enlarged lateral nodes  are present, tender to palpation on the left.   Lungs:  Clear to auscultation.  Breath sounds are equal.  Moving air well. Heart:  Regular rate and rhythm without murmurs, rubs, or gallops.  Abdomen:  Nontender without masses or hepatosplenomegaly.  Bowel sounds are present.  No CVA or flank tenderness.  Extremities:  No edema.  Skin:  No rash present.   UC Treatments / Results  Labs (all labs ordered are listed, but only abnormal results are displayed) Labs Reviewed  SARS CORONAVIRUS 2 BY RT PCR    EKG   Radiology No results found.  Procedures Procedures (including critical care time)  Medications Ordered in UC Medications - No data to display  Initial Impression / Assessment and Plan / UC Course  I have reviewed the triage vital signs and the nursing notes.  Pertinent labs & imaging results that were available during my care of the patient were reviewed by me and considered in my medical decision making (see chart for details).    There is no definite evidence of bacterial infection today.  Treat symptomatically for now  COVID19 PCR pending.  Given Rx for amoxicillin to hold in case left otitis media develops. Followup with Family Doctor if not improved in about 10 days.  Final Clinical Impressions(s) / UC Diagnoses   Final diagnoses:  At increased risk of exposure to COVID-19 virus  Viral URI with cough     Discharge Instructions      Take plain guaifenesin ('1200mg'$  extended release tabs such as Mucinex) twice daily, with plenty of water, for cough and congestion.  May add Pseudoephedrine ('30mg'$ , one or two every 4 to 6 hours) for sinus congestion.  Get adequate rest.   May use Afrin nasal spray (or generic oxymetazoline) each morning for about 5 days and then discontinue.  Also recommend using saline nasal spray several times daily and saline nasal irrigation (AYR is a common brand).  Use Flonase nasal spray each morning after using Afrin nasal spray and saline nasal  irrigation. Try warm salt water gargles for sore throat.  Stop all antihistamines for now, and other non-prescription cough/cold preparations. May take Delsym Cough Suppressant ("12 Hour Cough Relief") at bedtime for nighttime cough.  Begin Amoxicillin if increasing facial pain or left ear pain develop.   If your COVID-19 test is positive, isolate yourself for five days from today.  At the end of five days you may end isolation if your symptoms have  cleared or improved, and you have not had a fever for 24 hours. At this time you should wear a mask for five more days when you are around others.              ED Prescriptions     Medication Sig Dispense Auth. Provider   amoxicillin (AMOXIL) 875 MG tablet Take 1 tablet (875 mg total) by mouth 2 (two) times daily. 14 tablet Kandra Nicolas, MD         Kandra Nicolas, MD 11/06/21 [phone]

## 2021-11-03 NOTE — ED Triage Notes (Signed)
Pt c/o sore throat, nasal congestion, facial pain and fatigue since Saturday. Taking mucinex day/night and chloraseptic spray prn. COVID test neg at home Saturday and Sunday. Hx of sinus infections.

## 2021-11-03 NOTE — Discharge Instructions (Signed)
Take plain guaifenesin ('1200mg'$  extended release tabs such as Mucinex) twice daily, with plenty of water, for cough and congestion.  May add Pseudoephedrine ('30mg'$ , one or two every 4 to 6 hours) for sinus congestion.  Get adequate rest.   May use Afrin nasal spray (or generic oxymetazoline) each morning for about 5 days and then discontinue.  Also recommend using saline nasal spray several times daily and saline nasal irrigation (AYR is a common brand).  Use Flonase nasal spray each morning after using Afrin nasal spray and saline nasal irrigation. Try warm salt water gargles for sore throat.  Stop all antihistamines for now, and other non-prescription cough/cold preparations. May take Delsym Cough Suppressant ("12 Hour Cough Relief") at bedtime for nighttime cough.  Begin Amoxicillin if increasing facial pain or left ear pain develop.   If your COVID-19 test is positive, isolate yourself for five days from today.  At the end of five days you may end isolation if your symptoms have cleared or improved, and you have not had a fever for 24 hours. At this time you should wear a mask for five more days when you are around others.

## 2021-11-16 ENCOUNTER — Encounter (INDEPENDENT_AMBULATORY_CARE_PROVIDER_SITE_OTHER): Payer: 59 | Admitting: Internal Medicine

## 2021-11-16 ENCOUNTER — Encounter (INDEPENDENT_AMBULATORY_CARE_PROVIDER_SITE_OTHER): Payer: Self-pay

## 2021-11-17 ENCOUNTER — Encounter (INDEPENDENT_AMBULATORY_CARE_PROVIDER_SITE_OTHER): Payer: 59 | Admitting: Internal Medicine

## 2021-11-22 DIAGNOSIS — F4320 Adjustment disorder, unspecified: Secondary | ICD-10-CM | POA: Diagnosis not present

## 2021-11-24 DIAGNOSIS — Z6826 Body mass index (BMI) 26.0-26.9, adult: Secondary | ICD-10-CM | POA: Diagnosis not present

## 2021-11-24 DIAGNOSIS — Z713 Dietary counseling and surveillance: Secondary | ICD-10-CM | POA: Diagnosis not present

## 2021-12-16 ENCOUNTER — Other Ambulatory Visit (HOSPITAL_COMMUNITY): Payer: Self-pay

## 2021-12-17 ENCOUNTER — Other Ambulatory Visit (HOSPITAL_BASED_OUTPATIENT_CLINIC_OR_DEPARTMENT_OTHER): Payer: Self-pay

## 2021-12-17 ENCOUNTER — Other Ambulatory Visit (HOSPITAL_COMMUNITY): Payer: Self-pay

## 2021-12-17 MED ORDER — TRETINOIN 0.025 % EX CREA
TOPICAL_CREAM | CUTANEOUS | 3 refills | Status: DC
  Filled 2021-12-17 (×2): qty 45, 30d supply, fill #0
  Filled 2022-07-10: qty 45, 30d supply, fill #1

## 2021-12-20 ENCOUNTER — Other Ambulatory Visit (HOSPITAL_BASED_OUTPATIENT_CLINIC_OR_DEPARTMENT_OTHER): Payer: Self-pay

## 2021-12-23 ENCOUNTER — Encounter: Payer: Self-pay | Admitting: Family Medicine

## 2021-12-23 ENCOUNTER — Ambulatory Visit: Payer: 59 | Admitting: Family Medicine

## 2021-12-23 VITALS — BP 128/84 | HR 95 | Temp 97.8°F | Wt 174.8 lb

## 2021-12-23 DIAGNOSIS — U071 COVID-19: Secondary | ICD-10-CM

## 2021-12-23 DIAGNOSIS — Z113 Encounter for screening for infections with a predominantly sexual mode of transmission: Secondary | ICD-10-CM

## 2021-12-23 DIAGNOSIS — R059 Cough, unspecified: Secondary | ICD-10-CM | POA: Diagnosis not present

## 2021-12-23 LAB — POC COVID19 BINAXNOW: SARS Coronavirus 2 Ag: POSITIVE — AB

## 2021-12-23 LAB — POCT INFLUENZA A/B
Influenza A, POC: NEGATIVE
Influenza B, POC: NEGATIVE

## 2021-12-23 MED ORDER — BENZONATATE 100 MG PO CAPS: 100.0000 mg | ORAL_CAPSULE | Freq: Two times a day (BID) | ORAL | 0 refills | Status: DC | PRN

## 2021-12-23 MED ORDER — FLUTICASONE PROPIONATE 50 MCG/ACT NA SUSP: 2.0000 | Freq: Every day | NASAL | 6 refills | Status: DC

## 2021-12-23 NOTE — Progress Notes (Addendum)
Assessment/Plan:   Problem List Items Addressed This Visit   None Visit Diagnoses     Cough, unspecified type    -  Primary   Relevant Orders   POC COVID-19 (Completed)   POCT Influenza A/B (Completed)   COVID       Relevant Medications   PFIZER COVID-19 VAC BIVALENT injection   TWINRIX 720-20 ELU-MCG/ML injection   benzonatate (TESSALON) 100 MG capsule   fluticasone (FLONASE) 50 MCG/ACT nasal spray   Screen for STD (sexually transmitted disease)       Relevant Orders   Hepatitis B core antibody, total   Hepatitis B surface antibody,qualitative   Hepatitis B surface antigen   Chlamydia/GC NAA, Confirmation   HIV Antibody (routine testing w rflx)   RPR   HCV Ab w Reflex to Quant PCR      COVID-19, low risk based on vaccination, history, age Recommend supportive care including Tessalon Perles, Flonase and OTC medications as needed    Subjective:  HPI:  Joseph Santiago is a 36 y.o. male who has Epididymal cyst; Nocturnal polyuria; Hyperlipemia; Epidermoid cyst; Anxiety about health; Hyperlipidemia; Annual physical exam; BMI 30.0-30.9,adult; Hypertension; Weight loss; Fatigue; Nausea; Chondromalacia of patellofemoral joint; and Exposure to COVID-19 virus on their problem list..   He  has a past medical history of BMI 30.0-30.9,adult and Hypertension.Marland Kitchen   He presents with chief complaint of Headache (Sore throat, head ache, cough , runny nose, chills, fatigue x 1 day. ) .   COVID19. He complains of achiness, fever headache, nasal congestion, non productive cough, and   .. Onset of symptoms was  2 days ago and worsening.He is drinking plenty of fluids.  Patient tested positive for COVID today in office.  Treatment to date:  mucinex and ibuprofen . Past history is significant for no history of pneumonia or bronchitis. Patient is non-smoker. COVID Vaccination status: Original series: Yes, Booster: Yes   ROS: The patient denies chest pain, dyspnea, wheezing or hemoptysis, no  abdominal pain  Sexually Transmitted Disease Check: Patient presents for sexually transmitted disease check. Sexual history reviewed with the patient. STD exposure: denies knowledge of risky exposure.  Previous history of STD:  none. Current symptoms include none.      Past Surgical History:  Procedure Laterality Date   WISDOM TOOTH EXTRACTION      Outpatient Medications Prior to Visit  Medication Sig Dispense Refill   Omega-3 Fatty Acids (FISH OIL) 1000 MG CAPS Take by mouth.     tretinoin (RETIN-A) 0.025 % cream Apply a dime-size amount to the entire face as tolerated each night 45 g 2   tretinoin (RETIN-A) 0.025 % cream Apply a dime-sized amount to entire face nightly as tolerated. 45 g 3   BOOSTRIX 5-2.5-18.5 LF-MCG/0.5 injection      PFIZER COVID-19 VAC BIVALENT injection      TWINRIX 720-20 ELU-MCG/ML injection      amoxicillin (AMOXIL) 875 MG tablet Take 1 tablet (875 mg total) by mouth 2 (two) times daily. (Patient not taking: Reported on 12/23/2021) 14 tablet 0   No facility-administered medications prior to visit.    Family History  Problem Relation Age of Onset   Hypertension Mother    Hypertension Father     Social History   Socioeconomic History   Marital status: Single    Spouse name: Not on file   Number of children: Not on file   Years of education: Not on file   Highest education level: Not on  file  Occupational History    Employer: [employer]  Tobacco Use   Smoking status: Never   Smokeless tobacco: Never  Vaping Use   Vaping Use: Never used  Substance and Sexual Activity   Alcohol use: No   Drug use: No   Sexual activity: Yes  Other Topics Concern   Not on file  Social History Narrative   Not on file   Social Determinants of Health   Financial Resource Strain: Not on file  Food Insecurity: Not on file  Transportation Needs: Not on file  Physical Activity: Not on file  Stress: Not on file  Social Connections: Not on file  Intimate  Partner Violence: Not on file                                                                                                 Objective:  Physical Exam: BP 128/84 (BP Location: Left Arm, Patient Position: Sitting, Cuff Size: Large)   Pulse 95   Temp 97.8 F (36.6 C) (Temporal)   Wt 174 lb 12.8 oz (79.3 kg)   SpO2 95%   BMI 27.38 kg/m    General: No acute distress. Awake and conversant.  Eyes: Normal conjunctiva, anicteric. Round symmetric pupils.  ENT: Hearing grossly intact. No nasal discharge.  Neck: Neck is supple. No masses or thyromegaly.  Respiratory: Respirations are non-labored. No auditory wheezing.  CTA B Skin: Warm. No rashes or ulcers.  Psych: Alert and oriented. Cooperative, Appropriate mood and affect, Normal judgment.  CV: No cyanosis or JVD, RRR, no MRG ABD: Nontender nondistended MSK: Normal ambulation. No clubbing  Neuro: Sensation and CN II-XII grossly normal.   Results for orders placed or performed in visit on 12/23/21  POC COVID-19  Result Value Ref Range   SARS Coronavirus 2 Ag Positive (A) Negative  POCT Influenza A/B  Result Value Ref Range   Influenza A, POC Negative Negative   Influenza B, POC Negative Negative        Alesia Banda, MD, MS

## 2021-12-23 NOTE — Patient Instructions (Signed)
For COVID, Quarantine at home for 5 days since the start of illness. After this you, may exit quarantine, but please still wear a mask indoors and outdoors for 5 more days.   Please be sure to drink plenty of fluids.   You may take the following OTC medications to help with symptoms:  For cough, use  cough syrups or other cough suppressants.  For headache, sore throat, fevers, muscle aches, chills, other pain, take ibuprofen or tylenol  For congestion, use nasal sprays, decongestants, or antihistamines  Please follow up if no improvement.   Go to ED if you have severe chest pain, fevers, shortness of breath or other worrisome symptoms.   

## 2021-12-24 LAB — HEPATITIS B SURFACE ANTIGEN: Hepatitis B Surface Ag: NONREACTIVE

## 2021-12-24 LAB — RPR: RPR Ser Ql: NONREACTIVE

## 2021-12-24 LAB — HIV ANTIBODY (ROUTINE TESTING W REFLEX): HIV 1&2 Ab, 4th Generation: NONREACTIVE

## 2021-12-24 LAB — HEPATITIS B CORE ANTIBODY, TOTAL: Hep B Core Total Ab: NONREACTIVE

## 2021-12-24 LAB — HEPATITIS B SURFACE ANTIBODY,QUALITATIVE: Hep B S Ab: REACTIVE — AB

## 2021-12-25 LAB — HCV AB W REFLEX TO QUANT PCR: HCV Ab: NONREACTIVE

## 2021-12-25 LAB — HCV INTERPRETATION

## 2021-12-27 LAB — CHLAMYDIA/GC NAA, CONFIRMATION
Chlamydia trachomatis, NAA: NEGATIVE
Neisseria gonorrhoeae, NAA: NEGATIVE

## 2022-01-31 DIAGNOSIS — F4320 Adjustment disorder, unspecified: Secondary | ICD-10-CM | POA: Diagnosis not present

## 2022-02-25 DIAGNOSIS — F4320 Adjustment disorder, unspecified: Secondary | ICD-10-CM | POA: Diagnosis not present

## 2022-03-09 ENCOUNTER — Encounter: Payer: Self-pay | Admitting: Family Medicine

## 2022-03-09 DIAGNOSIS — J309 Allergic rhinitis, unspecified: Secondary | ICD-10-CM | POA: Insufficient documentation

## 2022-03-11 ENCOUNTER — Ambulatory Visit: Payer: Commercial Managed Care - PPO | Admitting: Family Medicine

## 2022-03-11 ENCOUNTER — Encounter: Payer: Self-pay | Admitting: Family Medicine

## 2022-03-11 VITALS — BP 98/63 | HR 78 | Temp 97.7°F | Ht 67.0 in | Wt 178.1 lb

## 2022-03-11 DIAGNOSIS — N489 Disorder of penis, unspecified: Secondary | ICD-10-CM

## 2022-03-11 DIAGNOSIS — Z136 Encounter for screening for cardiovascular disorders: Secondary | ICD-10-CM | POA: Diagnosis not present

## 2022-03-11 DIAGNOSIS — D170 Benign lipomatous neoplasm of skin and subcutaneous tissue of head, face and neck: Secondary | ICD-10-CM | POA: Diagnosis not present

## 2022-03-11 DIAGNOSIS — Z1151 Encounter for screening for human papillomavirus (HPV): Secondary | ICD-10-CM

## 2022-03-11 DIAGNOSIS — Z13220 Encounter for screening for lipoid disorders: Secondary | ICD-10-CM

## 2022-03-11 DIAGNOSIS — Z7689 Persons encountering health services in other specified circumstances: Secondary | ICD-10-CM | POA: Diagnosis not present

## 2022-03-11 NOTE — Progress Notes (Signed)
New Patient Office Visit  Subjective    Patient ID: Joseph Santiago, male    DOB: 1985/06/24  Age: 37 y.o. MRN: [id number]  CC:  Chief Complaint  Patient presents with   New Patient (Initial Visit)    Patient in office to est PCP - requesting blood work  especially lipid panel- pt also c/o mid back pain x 1 week happened while doing shoulder presses at gym = also c/o bump on upper forehead that has recently gotten larger.- x 8 years (patient concerned because grandfather died with brain cancer)     HPI Joseph Santiago presents to establish . Pt is new to me  He is a Furniture conservator/restorer.  He reports a bump on his head. Been present for the last 8 years and enlarged in the last year. He would like this checked. He also reports a left knee condition. He is seeing Ortho for the last 3 years and has upcoming appt for this. He would like a dietitian that can write him a 7 day plan. He reports hx of leaky heart valve and the  provider said he had high cholesterol. He wanted to have better diet plan to help.  He says he has bumps on his penis that he would like checked. This has been since 2019. He reports they come and go. No penile discharge or dysuria.  [place] Office Visit from 03/11/2022 in [facility]  PHQ-9 Total Score 10        Outpatient Encounter Medications as of 03/11/2022  Medication Sig   Omega-3 Fatty Acids (FISH OIL) 1000 MG CAPS Take by mouth.   tretinoin (RETIN-A) 0.025 % cream Apply a dime-size amount to the entire face as tolerated each night   tretinoin (RETIN-A) 0.025 % cream Apply a dime-sized amount to entire face nightly as tolerated.   [DISCONTINUED] benzonatate (TESSALON) 100 MG capsule Take 1 capsule (100 mg total) by mouth 2 (two) times daily as needed for cough. (Patient not taking: Reported on 03/11/2022)   [DISCONTINUED] BOOSTRIX 5-2.5-18.5 LF-MCG/0.5 injection  (Patient not taking: Reported on 03/11/2022)   [DISCONTINUED] fluticasone  (FLONASE) 50 MCG/ACT nasal spray Place 2 sprays into both nostrils daily. (Patient not taking: Reported on 03/11/2022)   [DISCONTINUED] PFIZER COVID-19 VAC BIVALENT injection  (Patient not taking: Reported on 03/11/2022)   [DISCONTINUED] TWINRIX 720-20 ELU-MCG/ML injection  (Patient not taking: Reported on 03/11/2022)   No facility-administered encounter medications on file as of 03/11/2022.    Past Medical History:  Diagnosis Date   BMI 30.0-30.9,adult    Hypertension     Past Surgical History:  Procedure Laterality Date   WISDOM TOOTH EXTRACTION      Family History  Problem Relation Age of Onset   Hypertension Mother    Hypertension Father     Social History   Socioeconomic History   Marital status: Single    Spouse name: Not on file   Number of children: Not on file   Years of education: Not on file   Highest education level: Not on file  Occupational History    Employer: [employer]   Occupation: Lawyer with [employer]  Tobacco Use   Smoking status: Never   Smokeless tobacco: Never  Vaping Use   Vaping Use: Never used  Substance and Sexual Activity   Alcohol use: No   Drug use: No   Sexual activity: Yes    Birth control/protection: Condom  Other  Topics Concern   Not on file  Social History Narrative   Not on file   Social Determinants of Health   Financial Resource Strain: Not on file  Food Insecurity: Not on file  Transportation Needs: Not on file  Physical Activity: Not on file  Stress: Not on file  Social Connections: Not on file  Intimate Partner Violence: Not on file    Review of Systems  HENT:         Lump on head  Genitourinary:        Penile lesion  All other systems reviewed and are negative.       Objective    BP 98/63   Pulse 78   Temp 97.7 F (36.5 C)   Ht '5\' 7"'$  (1.702 m)   Wt 178 lb 1 oz (80.8 kg)   SpO2 99%   BMI 27.89 kg/m   Physical Exam Vitals and nursing note reviewed.  Constitutional:       Appearance: Normal appearance. He is normal weight.  HENT:     Head: Normocephalic and atraumatic.     Comments: Cystic mobile lump near widow's peak measuring 2x1 cm    Right Ear: Tympanic membrane, ear canal and external ear normal.     Left Ear: Tympanic membrane, ear canal and external ear normal.     Nose: Nose normal.     Mouth/Throat:     Mouth: Mucous membranes are moist.     Pharynx: Oropharynx is clear.  Eyes:     Conjunctiva/sclera: Conjunctivae normal.     Pupils: Pupils are equal, round, and reactive to light.  Cardiovascular:     Rate and Rhythm: Normal rate and regular rhythm.     Pulses: Normal pulses.     Heart sounds: Normal heart sounds.  Pulmonary:     Effort: Pulmonary effort is normal.     Breath sounds: Normal breath sounds.  Abdominal:     General: Abdomen is flat. Bowel sounds are normal.  Genitourinary:    Testes: Normal.     Comments: Whitish Papillomas on penile head in a row Skin:    General: Skin is warm.     Capillary Refill: Capillary refill takes less than 2 seconds.  Neurological:     General: No focal deficit present.     Mental Status: He is alert and oriented to person, place, and time. Mental status is at baseline.  Psychiatric:        Mood and Affect: Mood normal.        Behavior: Behavior normal.        Thought Content: Thought content normal.        Judgment: Judgment normal.        Assessment & Plan:   Problem List Items Addressed This Visit   None Visit Diagnoses     Encounter to establish care with new doctor    -  Primary   Encounter for lipid screening for cardiovascular disease       Relevant Orders   Lipid panel   Penile lesion       Screening for HPV (human papillomavirus)       Relevant Orders   Virus culture   Lipoma of head          Will recheck lipid today. Reassured pt that per las lab, his LDL was only 12 points above goal. This can be managed with continued diet and exercise. Pt would like to be  screened for HPV today.  He does have papular lesions on penis but resemble 'pearly penile papules'. Will follow up on HPV test. Reassured pt lesion on head is lipoma. Benign condition will continue to monitor.  Discussed pt PHQ score today. He states it's just issues and stressors that are normal and he is managing.  Return in about 6 months (around 09/22/2022) for Annual Physical.   Leeanne Rio, MD

## 2022-03-12 LAB — LIPID PANEL
Chol/HDL Ratio: 2.6 ratio (ref 0.0–5.0)
Cholesterol, Total: 197 mg/dL (ref 100–199)
HDL: 77 mg/dL (ref >39)
LDL Chol Calc (NIH): 112 mg/dL — ABNORMAL HIGH (ref 0–99)
Triglycerides: 41 mg/dL (ref 0–149)
VLDL Cholesterol Cal: 8 mg/dL (ref 5–40)

## 2022-03-18 LAB — VIRUS CULTURE

## 2022-03-22 DIAGNOSIS — M25562 Pain in left knee: Secondary | ICD-10-CM | POA: Diagnosis not present

## 2022-03-22 DIAGNOSIS — R531 Weakness: Secondary | ICD-10-CM | POA: Diagnosis not present

## 2022-03-30 DIAGNOSIS — R531 Weakness: Secondary | ICD-10-CM | POA: Diagnosis not present

## 2022-03-30 DIAGNOSIS — M25562 Pain in left knee: Secondary | ICD-10-CM | POA: Diagnosis not present

## 2022-04-06 DIAGNOSIS — F4320 Adjustment disorder, unspecified: Secondary | ICD-10-CM | POA: Diagnosis not present

## 2022-04-13 DIAGNOSIS — M25562 Pain in left knee: Secondary | ICD-10-CM | POA: Diagnosis not present

## 2022-04-13 DIAGNOSIS — R531 Weakness: Secondary | ICD-10-CM | POA: Diagnosis not present

## 2022-05-03 DIAGNOSIS — F4320 Adjustment disorder, unspecified: Secondary | ICD-10-CM | POA: Diagnosis not present

## 2022-06-11 ENCOUNTER — Encounter: Payer: Self-pay | Admitting: *Deleted

## 2022-06-11 LAB — AMB RESULTS CONSOLE CBG: Glucose: 87

## 2022-06-20 ENCOUNTER — Encounter: Payer: Self-pay | Admitting: *Deleted

## 2022-06-20 NOTE — Progress Notes (Addendum)
Pt attended 06/11/22 screening event where screening results were wnl. At event, pt noted his PCP was Meryl Dare MD at [facility]  and did not identify any SDOH insecurities. Chart review indicates pt was seen by Dr. Wyline Mood on 03/01/22. No additional health equity team support indicated at this time.

## 2022-06-24 DIAGNOSIS — F4320 Adjustment disorder, unspecified: Secondary | ICD-10-CM | POA: Diagnosis not present

## 2022-06-25 DIAGNOSIS — H52223 Regular astigmatism, bilateral: Secondary | ICD-10-CM | POA: Diagnosis not present

## 2022-06-29 ENCOUNTER — Encounter: Payer: Self-pay | Admitting: Emergency Medicine

## 2022-06-29 ENCOUNTER — Ambulatory Visit
Admission: EM | Admit: 2022-06-29 | Discharge: 2022-06-29 | Disposition: A | Discharge status: Home / Self Care | Payer: Commercial Managed Care - PPO | Attending: Family Medicine | Admitting: Family Medicine

## 2022-06-29 DIAGNOSIS — J029 Acute pharyngitis, unspecified: Secondary | ICD-10-CM | POA: Insufficient documentation

## 2022-06-29 LAB — POCT RAPID STREP A (OFFICE): Rapid Strep A Screen: NEGATIVE

## 2022-06-29 LAB — POC SARS CORONAVIRUS 2 AG -  ED: SARS Coronavirus 2 Ag: NEGATIVE

## 2022-06-29 MED ORDER — ACETAMINOPHEN 500 MG PO TABS
1000.0000 mg | ORAL_TABLET | Freq: Once | ORAL | Status: AC
  Administered 2022-06-29: 1000 mg via ORAL

## 2022-06-29 NOTE — ED Triage Notes (Signed)
Patient c/o headache, cough, sore throat and pressure behind his ears since yesterday.  Patient has gargled w/salt water and throat lozenges.

## 2022-06-29 NOTE — Discharge Instructions (Signed)
Your COVID test is negative Your strep test is negative You need rest, fluids, over-the-counter cough and cold medicines Call for problems

## 2022-06-29 NOTE — ED Provider Notes (Signed)
[facility]    CSN: [id number] Arrival date & time: 06/29/22  4098      History   Chief Complaint Chief Complaint  Patient presents with   Cough    HPI Joseph Santiago is a 37 y.o. male.   HPI -patient states he has had headache sore throat and fever since yesterday.  No cough or runny nose.  Throat is painful.  No known exposure to strep. He had COVID in October 2023. No sweats chills or bodyaches.  He does have headache   Past Medical History:  Diagnosis Date   BMI 30.0-30.9,adult    Hypertension     Patient Active Problem List   Diagnosis Date Noted   Allergic rhinitis 03/09/2022   Exposure to COVID-19 virus 09/20/2021   Chondromalacia of patellofemoral joint 02/17/2021   Weight loss 07/08/2020   Fatigue 07/08/2020   Nausea 07/08/2020   BMI 30.0-30.9,adult    Hypertension    Annual physical exam 07/22/2019   Hyperlipidemia 02/15/2019   Anxiety about health 12/29/2018   Epidermoid cyst 12/26/2018   Hyperlipemia 11/30/2018   Epididymal cyst 11/28/2018   Nocturnal polyuria 11/28/2018    Past Surgical History:  Procedure Laterality Date   WISDOM TOOTH EXTRACTION         Home Medications    Prior to Admission medications   Medication Sig Start Date End Date Taking? Authorizing Provider  Omega-3 Fatty Acids (FISH OIL) 1000 MG CAPS Take by mouth.   Yes [provider]  tretinoin (RETIN-A) 0.025 % cream Apply a dime-sized amount to entire face nightly as tolerated. 12/17/21  Yes     Family History Family History  Problem Relation Age of Onset   Hypertension Mother    Hypertension Father     Social History Social History   Tobacco Use   Smoking status: Never   Smokeless tobacco: Never  Vaping Use   Vaping Use: Never used  Substance Use Topics   Alcohol use: No   Drug use: No     Allergies   Bactrim [sulfamethoxazole-trimethoprim], Dust mite extract, and Other   Review of Systems Review of Systems  See  HPI Physical Exam Triage Vital Signs ED Triage Vitals  Enc Vitals Group     BP 06/29/22 0939 121/75     Pulse Rate 06/29/22 0939 97     Resp 06/29/22 0939 18     Temp 06/29/22 0939 (!) 100.6 F (38.1 C)     Temp Source 06/29/22 0939 Oral     SpO2 06/29/22 0939 96 %     Weight 06/29/22 0941 181 lb (82.1 kg)     Height 06/29/22 0941 5' 6.75" (1.695 m)     Head Circumference --      Peak Flow --      Pain Score 06/29/22 0941 8     Pain Loc --      Pain Edu? --      Excl. in GC? --    No data found.  Updated Vital Signs BP 121/75 (BP Location: Left Arm)   Pulse 97   Temp (!) 100.6 F (38.1 C) (Oral)   Resp 18   Ht 5' 6.75" (1.695 m)   Wt 82.1 kg   SpO2 96%   BMI 28.56 kg/m       Physical Exam Constitutional:      General: He is not in acute distress.    Appearance: He is well-developed.  HENT:     Head: Normocephalic  and atraumatic.     Right Ear: Tympanic membrane and ear canal normal.     Left Ear: Tympanic membrane and ear canal normal.     Nose: Nose normal. No congestion.     Mouth/Throat:     Mouth: Mucous membranes are moist.     Pharynx: Posterior oropharyngeal erythema present.  Eyes:     Conjunctiva/sclera: Conjunctivae normal.     Pupils: Pupils are equal, round, and reactive to light.  Cardiovascular:     Rate and Rhythm: Normal rate and regular rhythm.     Heart sounds: Normal heart sounds.  Pulmonary:     Effort: Pulmonary effort is normal. No respiratory distress.     Breath sounds: Normal breath sounds.  Abdominal:     General: There is no distension.     Palpations: Abdomen is soft.  Musculoskeletal:        General: Normal range of motion.     Cervical back: Normal range of motion.  Lymphadenopathy:     Cervical: No cervical adenopathy.  Skin:    General: Skin is warm and dry.  Neurological:     Mental Status: He is alert.      UC Treatments / Results  Labs (all labs ordered are listed, but only abnormal results are  displayed) Labs Reviewed  CULTURE, GROUP A STREP [facility])  POCT RAPID STREP A (OFFICE)  POC SARS CORONAVIRUS 2 AG -  ED    EKG   Radiology No results found.  Procedures Procedures (including critical care time)  Medications Ordered in UC Medications  acetaminophen (TYLENOL) tablet 1,000 mg (1,000 mg Oral Given 06/29/22 0949)    Initial Impression / Assessment and Plan / UC Course  I have reviewed the triage vital signs and the nursing notes.  Pertinent labs & imaging results that were available during my care of the patient were reviewed by me and considered in my medical decision making (see chart for details).     Discussed symptomatic care of viral illness Final Clinical Impressions(s) / UC Diagnoses   Final diagnoses:  Acute viral pharyngitis     Discharge Instructions      Your COVID test is negative Your strep test is negative You need rest, fluids, over-the-counter cough and cold medicines Call for problems     ED Prescriptions   None    PDMP not reviewed this encounter.   Eustace Moore, MD 06/29/22 [phone]

## 2022-06-30 ENCOUNTER — Telehealth: Payer: Self-pay | Admitting: Emergency Medicine

## 2022-06-30 LAB — CULTURE, GROUP A STREP (THRC)

## 2022-06-30 NOTE — Telephone Encounter (Signed)
LMTRC.  Advised if doing well to disregard the call.  Any questions or concerns feel free to give the office a call. 

## 2022-07-01 LAB — CULTURE, GROUP A STREP (THRC)

## 2022-07-11 ENCOUNTER — Other Ambulatory Visit (HOSPITAL_COMMUNITY): Payer: Self-pay

## 2022-07-11 DIAGNOSIS — F4320 Adjustment disorder, unspecified: Secondary | ICD-10-CM | POA: Diagnosis not present

## 2022-07-12 ENCOUNTER — Encounter: Payer: Self-pay | Admitting: Pharmacist

## 2022-07-12 ENCOUNTER — Other Ambulatory Visit (HOSPITAL_COMMUNITY): Payer: Self-pay

## 2022-07-12 ENCOUNTER — Other Ambulatory Visit: Payer: Self-pay

## 2022-08-02 DIAGNOSIS — F4320 Adjustment disorder, unspecified: Secondary | ICD-10-CM | POA: Diagnosis not present

## 2022-08-13 LAB — AMB RESULTS CONSOLE CBG: Glucose: 94

## 2022-08-24 ENCOUNTER — Encounter: Payer: Self-pay | Admitting: *Deleted

## 2022-08-24 NOTE — Progress Notes (Signed)
Pt attended 2nd screening event on 6//15/24 where his b/p was 122/77 and his blood sugar was 94. At the time of the event, pt did not identify any SDOH insecurities or change in PCP of record in [facility], Dr. Sundra Aland. Chart review indicates pt last saw PCP on 03/11/22 and has used telehealth and [facility] for interim healthcare needs. No additional health equity team support indicated at this time.

## 2022-08-27 IMAGING — DX DG CHEST 2V
2 series · 2 of 2 positions shown · non-contrast
Comparison: None.

CLINICAL DATA: Shortness of breath, wheezing and productive cough.

EXAM:
CHEST - 2 VIEW

[chest pa]
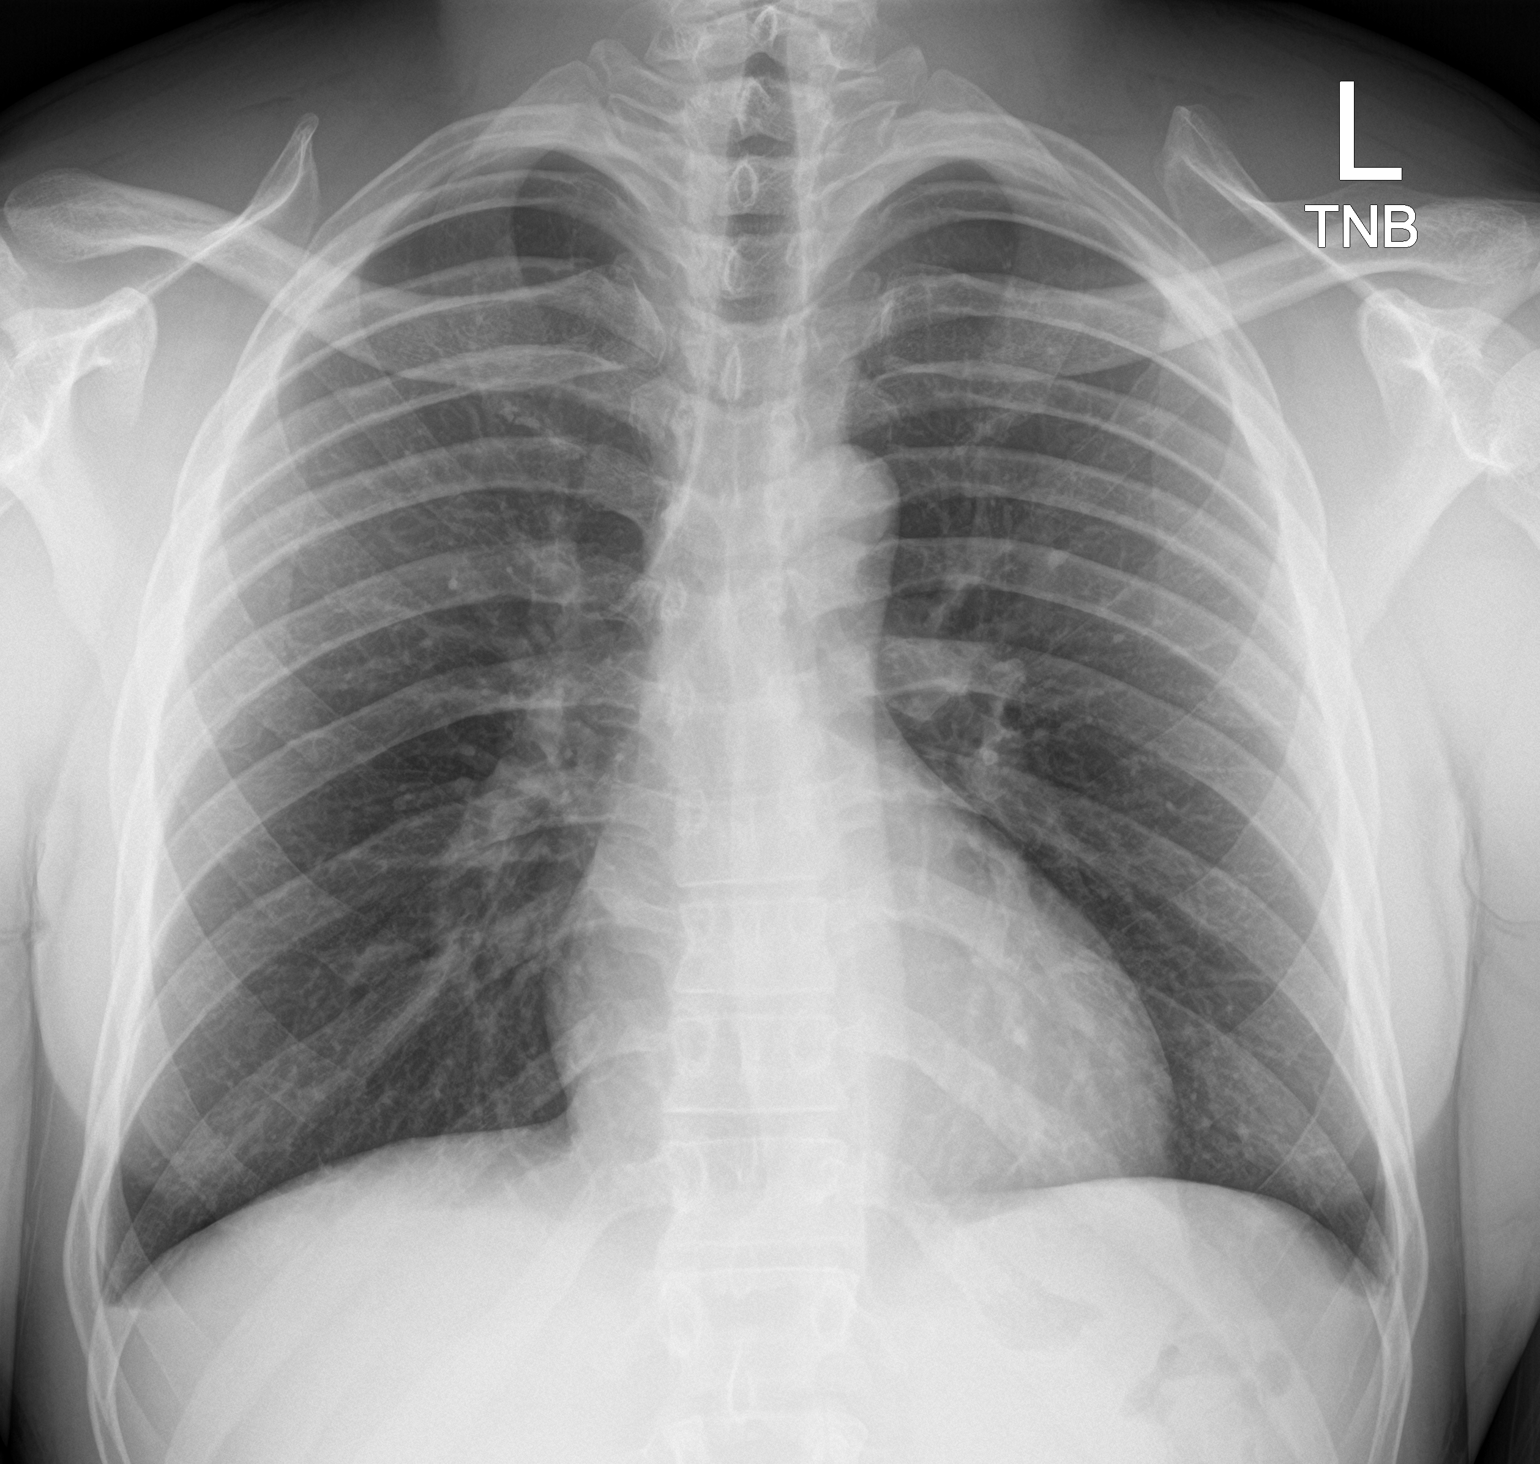

[chest lat]
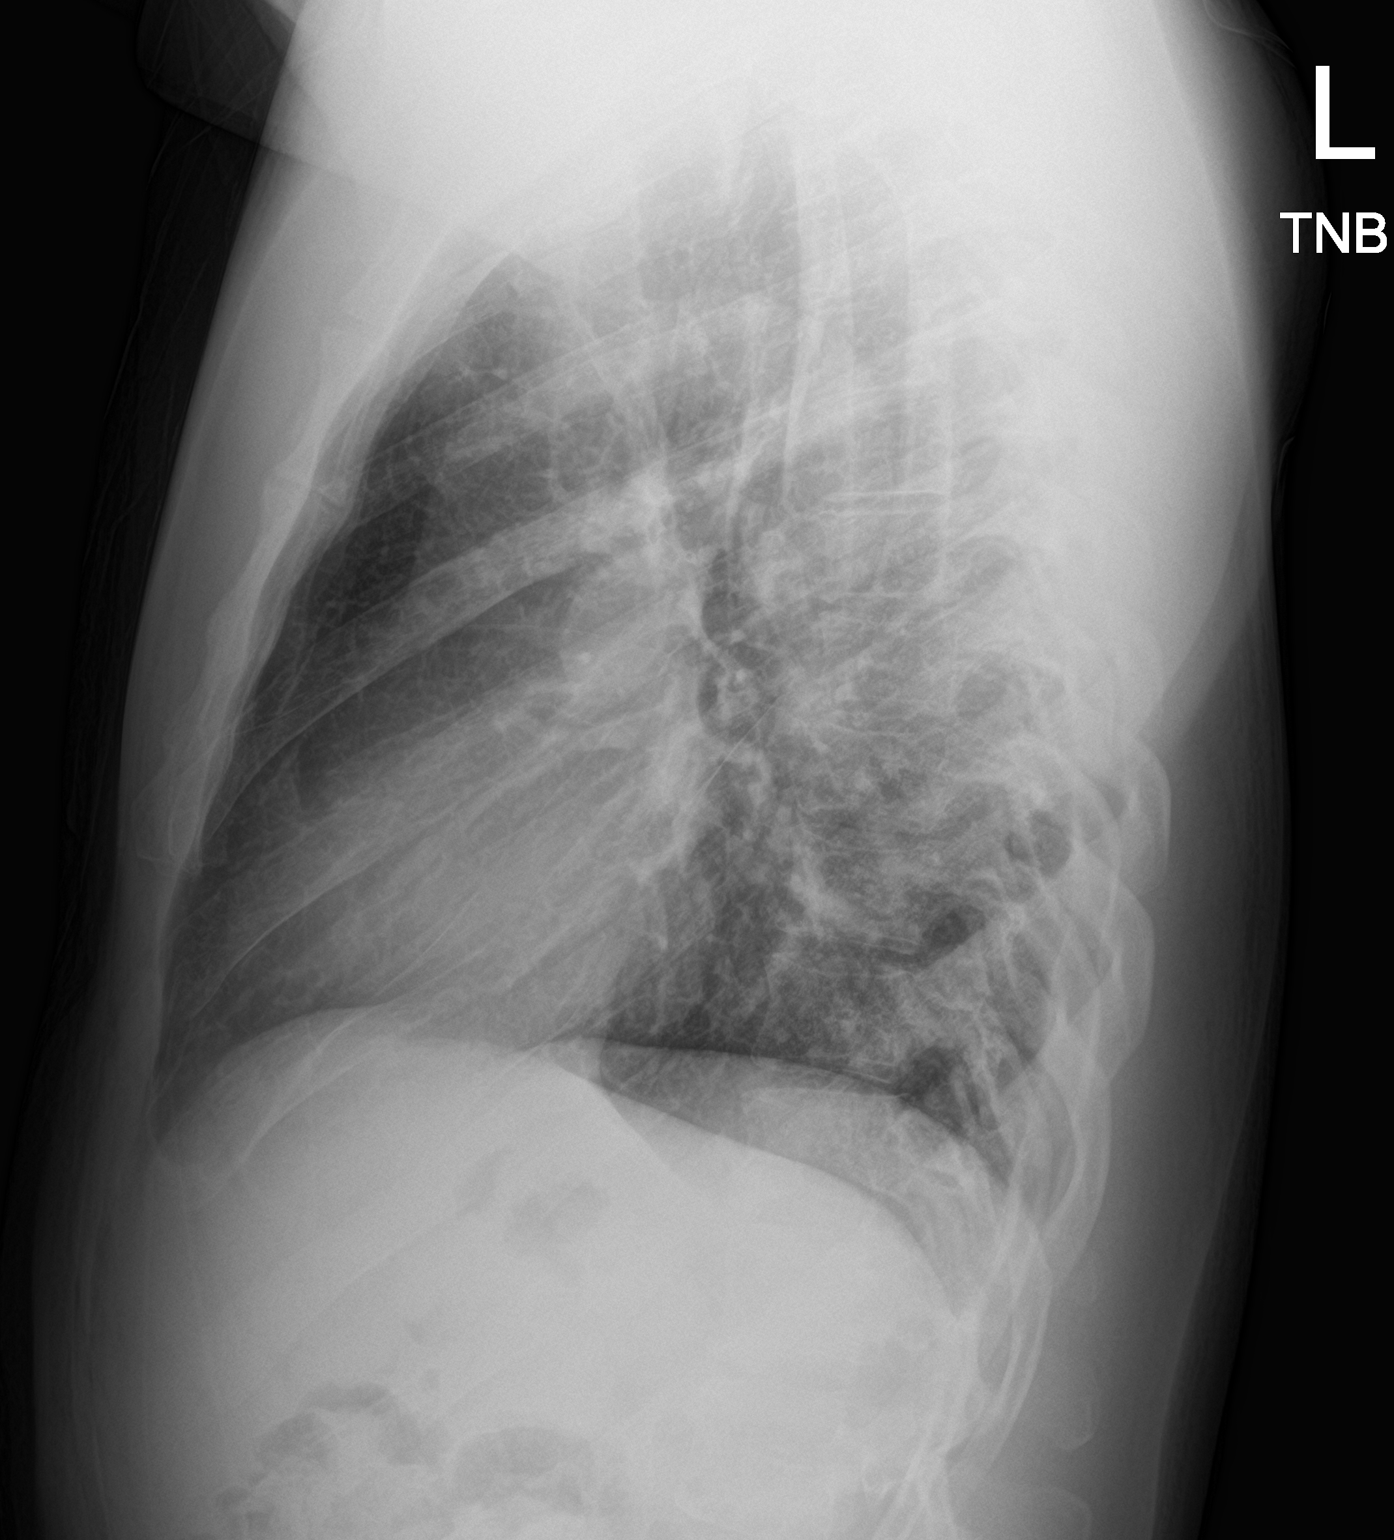

[2 of 2 positions shown; findings below may reference images not displayed]

FINDINGS: The lungs are clear and negative for focal airspace consolidation,
pulmonary edema or suspicious pulmonary nodule. No pleural effusion
or pneumothorax. Cardiac and mediastinal contours are within normal
limits. No acute fracture or lytic or blastic osseous lesions. The
visualized upper abdominal bowel gas pattern is unremarkable.
IMPRESSION: Normal chest x-ray.

## 2022-09-06 DIAGNOSIS — F4320 Adjustment disorder, unspecified: Secondary | ICD-10-CM | POA: Diagnosis not present

## 2022-10-04 DIAGNOSIS — F4320 Adjustment disorder, unspecified: Secondary | ICD-10-CM | POA: Diagnosis not present

## 2022-11-01 DIAGNOSIS — F4320 Adjustment disorder, unspecified: Secondary | ICD-10-CM | POA: Diagnosis not present

## 2022-12-06 DIAGNOSIS — F4320 Adjustment disorder, unspecified: Secondary | ICD-10-CM | POA: Diagnosis not present

## 2022-12-15 ENCOUNTER — Ambulatory Visit: Payer: Commercial Managed Care - PPO | Admitting: Physician Assistant

## 2022-12-15 DIAGNOSIS — Z23 Encounter for immunization: Secondary | ICD-10-CM

## 2022-12-15 NOTE — Progress Notes (Signed)
Flu and Comirnaty vaccine administered left deltoid. Pt observed for 15 mins tolerated injections well

## 2022-12-15 NOTE — Patient Instructions (Signed)
immunization

## 2023-01-08 DIAGNOSIS — F4320 Adjustment disorder, unspecified: Secondary | ICD-10-CM | POA: Diagnosis not present

## 2023-02-07 DIAGNOSIS — F4320 Adjustment disorder, unspecified: Secondary | ICD-10-CM | POA: Diagnosis not present

## 2023-02-13 ENCOUNTER — Other Ambulatory Visit (HOSPITAL_COMMUNITY): Payer: Self-pay

## 2023-02-23 ENCOUNTER — Other Ambulatory Visit (HOSPITAL_COMMUNITY): Payer: Self-pay

## 2023-02-23 MED ORDER — TRETINOIN 0.025 % EX CREA
TOPICAL_CREAM | CUTANEOUS | 2 refills | Status: AC
  Filled 2023-02-23: qty 45, 30d supply, fill #0
  Filled 2023-02-24 – 2023-05-23 (×2): qty 45, 30d supply, fill #1
  Filled 2023-08-21: qty 45, 30d supply, fill #2

## 2023-02-24 ENCOUNTER — Other Ambulatory Visit: Payer: Self-pay

## 2023-03-07 DIAGNOSIS — F4320 Adjustment disorder, unspecified: Secondary | ICD-10-CM | POA: Diagnosis not present

## 2023-03-09 ENCOUNTER — Encounter: Payer: Self-pay | Admitting: Sports Medicine

## 2023-03-09 ENCOUNTER — Ambulatory Visit: Payer: Commercial Managed Care - PPO | Admitting: Sports Medicine

## 2023-03-09 ENCOUNTER — Ambulatory Visit: Payer: Commercial Managed Care - PPO

## 2023-03-09 DIAGNOSIS — M12519 Traumatic arthropathy, unspecified shoulder: Secondary | ICD-10-CM

## 2023-03-09 DIAGNOSIS — M25512 Pain in left shoulder: Secondary | ICD-10-CM | POA: Diagnosis not present

## 2023-03-09 DIAGNOSIS — S4992XA Unspecified injury of left shoulder and upper arm, initial encounter: Secondary | ICD-10-CM | POA: Diagnosis not present

## 2023-03-09 DIAGNOSIS — M12512 Traumatic arthropathy, left shoulder: Secondary | ICD-10-CM

## 2023-03-09 MED ORDER — MELOXICAM 15 MG PO TABS
ORAL_TABLET | ORAL | 3 refills | Status: DC
Start: 2023-03-09 — End: 2023-08-15

## 2023-03-09 NOTE — Assessment & Plan Note (Signed)
 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 He does bench press around 400 pounds. 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 He will hold off on straight and incline bench work only declined which, avoid cross our maneuvers, we will do meloxicam  for a week and x-rays. He can return to see me as needed.  This will typically resolve on its own with minimizing bench press.

## 2023-03-09 NOTE — Progress Notes (Signed)
    Procedures performed today:    None.  Independent interpretation of notes and tests performed by another provider:   None.  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 He does bench press around 400 pounds. 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 He will hold off on straight and incline bench work only declined which, avoid cross our maneuvers, we will do meloxicam  for a week and x-rays. He can return to see me as needed.  This will typically resolve on its own with minimizing bench press.    ____________________________________________ Debby PARAS. Curtis, M.D., ABFM., CAQSM., AME. Primary Care and Sports Medicine [facility]  Adjunct Professor of [facility] Medicine  [employer]  [employer], [employer]

## 2023-03-18 ENCOUNTER — Encounter: Payer: Self-pay | Admitting: Sports Medicine

## 2023-03-23 ENCOUNTER — Ambulatory Visit: Payer: Commercial Managed Care - PPO | Admitting: Family Medicine

## 2023-03-23 ENCOUNTER — Encounter: Payer: Self-pay | Admitting: Family Medicine

## 2023-03-23 VITALS — BP 94/61 | HR 87 | Temp 98.2°F | Resp 18 | Ht 66.75 in | Wt 185.9 lb

## 2023-03-23 DIAGNOSIS — L0291 Cutaneous abscess, unspecified: Secondary | ICD-10-CM | POA: Diagnosis not present

## 2023-03-23 MED ORDER — DOXYCYCLINE HYCLATE 100 MG PO TABS
100.0000 mg | ORAL_TABLET | Freq: Two times a day (BID) | ORAL | 0 refills | Status: AC
Start: 2023-03-23 — End: 2023-03-30

## 2023-03-23 NOTE — Progress Notes (Signed)
   Acute Office Visit  Subjective:     Patient ID: Joseph Santiago, male    DOB: [birth year], 38 y.o.   MRN: [id number]  No chief complaint on file.   HPI Patient is in today for acute visit.  Pt reports a bump popped up on his chest a few days ago. He reports it does hurt to touch. Not draining. He reports he was seeing sports medicine provider for shoulder pain. He's had an injection for this and was given Mobic 15mg  to take prn for pain. He wondered if this was causing the bump on his chest.   Review of Systems  Skin:        Bump to chest  All other systems reviewed and are negative.      Objective:    There were no vitals taken for this visit. BP Readings from Last 3 Encounters:  03/23/23 94/61  08/13/22 122/77  06/29/22 121/75      Physical Exam Vitals and nursing note reviewed.  Constitutional:      Appearance: Normal appearance. He is normal weight.  HENT:     Head: Normocephalic and atraumatic.     Right Ear: External ear normal.     Left Ear: External ear normal.     Nose: Nose normal.     Mouth/Throat:     Mouth: Mucous membranes are moist.     Pharynx: Oropharynx is clear.  Eyes:     Conjunctiva/sclera: Conjunctivae normal.     Pupils: Pupils are equal, round, and reactive to light.  Cardiovascular:     Rate and Rhythm: Normal rate.  Pulmonary:     Effort: Pulmonary effort is normal.  Skin:    General: Skin is warm.     Capillary Refill: Capillary refill takes less than 2 seconds.     Comments: Papule to chest wall with induration measuring 2x2 cm  Neurological:     General: No focal deficit present.     Mental Status: He is alert and oriented to person, place, and time. Mental status is at baseline.  Psychiatric:        Mood and Affect: Mood normal.        Behavior: Behavior normal.        Thought Content: Thought content normal.        Judgment: Judgment normal.   No results found for any visits on 03/23/23.      Assessment & Plan:    Problem List Items Addressed This Visit   None  Abscess -     Doxycycline Hyclate; Take 1 tablet (100 mg total) by mouth 2 (two) times daily for 7 days.  Dispense: 14 tablet; Refill: 0   Pt with abscess to chest with hair growth, likely from folliculitis. Treat with warm compresses and Doxy 100mg  BID x 7 days.  No orders of the defined types were placed in this encounter.   No follow-ups on file.  Suzan Slick, MD

## 2023-03-27 ENCOUNTER — Encounter: Payer: Commercial Managed Care - PPO | Admitting: Family Medicine

## 2023-04-03 DIAGNOSIS — F4320 Adjustment disorder, unspecified: Secondary | ICD-10-CM | POA: Diagnosis not present

## 2023-05-01 ENCOUNTER — Encounter: Payer: Self-pay | Admitting: Cardiovascular Disease

## 2023-05-01 NOTE — Progress Notes (Unsigned)
 Cardiology Office Note:    Date:  05/02/2023   ID:  Joseph Santiago, DOB 26-Dec-1985, MRN [id number]  PCP:  Suzan Slick, MD  Cardiologist:  Timiya Howells  Electrophysiologist:  None   Referring MD: Suzan Slick, MD   Chief Complaint  Patient presents with   Hyperlipidemia    History of Present Illness:    Joseph Santiago is a 38 y.o. male with a hx of HTN, hyperlipidemia   We were asked to see him today by Dr. Dareen Piano for further evaluation of his hx of HTN and hyperlipiemia .    He is in the [employer] program at [employer].  Has a hx of hyperlipidemia and HTN Has had lifestyle changes since being diagnoses with HTN and HLD . His HLD has improved.  BP is now normal.      2 years ago was significant tachypalpitations and was having trouble sleeping.  He had an extensive work-up at [facility].  A limited echocardiogram revealed normal left ventricular size and function.  Ejection fraction was 60 to 65%.  He had trace mitral regurgitation, mild tricuspid regurgitation.  Right ventricular pressures were normal.  CTA of the chest revealed no evidence of pulmonary embolus.  There were no other pulmonary or cardiac concerns seen on the CT scan.  He has improved his diet and exercise / workouts.  No CP or dyspnea  Non smoker Does not drink   Only med is flomax to help prevent nocturia  Does 3  days a week of cardio  And does weight on the same days   + family hx of HTN .  Jan. 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  He will then make a decision about his next step. Has lost 30 lbs since I last saw him Change in his diet, exercise  His mild CKD - has had HTN for years Lipids were reviewed. Lipids are up slightly .  Has a family hx of premature CAD   Feb. 27, 2023: Joseph Santiago is seen for follow up of his HLD and HTN .  Wt is 173.   Now works for [employer] - runs [employer] out 5 days a week .   Cardio / abs / 2 body parts  Lipids  from Feb. 2023 look great   May 02, 2023: Joseph Santiago is seen today for follow-up of his hypertension and hyperlipidemia.  He works out regularly.  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     Allergies:   Bactrim [sulfamethoxazole-trimethoprim], Dust mite extract, and Other   Social History   Socioeconomic History   Marital status: Single    Spouse name: Not on file   Number of children: Not on file   Years of education: Not on file   Highest education level: Master's degree (e.g., MA, MS, MEng, MEd, MSW, MBA)  Occupational History    Employer: [employer]   Occupation: Higher education careers adviser with [employer]  Tobacco Use   Smoking status: Never   Smokeless tobacco: Never  Vaping Use   Vaping status: Never Used  Substance and Sexual Activity   Alcohol use: No   Drug use: No   Sexual activity: Yes    Birth control/protection: Condom  Other Topics Concern   Not on file  Social History Narrative   Not on file   Social Drivers of Health   Financial Resource Strain: Low Risk  (03/07/2023)   Overall Financial Resource Strain (CARDIA)    Difficulty of Paying Living Expenses: Not hard at all  Food Insecurity: No Food Insecurity (03/07/2023)   Hunger Vital Sign    Worried About Running Out of Food in the Last Year: Never true    Ran Out of Food in the Last Year: Never true  Transportation Needs: No Transportation Needs (03/07/2023)   PRAPARE - Administrator, Civil Service (Medical): No    Lack of Transportation (Non-Medical): No   Physical Activity: Sufficiently Active (03/07/2023)   Exercise Vital Sign    Days of Exercise per Week: 5 days    Minutes of Exercise per Session: 90 min  Stress: Stress Concern Present (03/07/2023)   [employer] of Occupational Health - Occupational Stress Questionnaire    Feeling of Stress : Rather much  Social Connections: Moderately Integrated (03/07/2023)   Social Connection and Isolation Panel [NHANES]    Frequency of Communication with Friends and Family: More than three times a week    Frequency of Social Gatherings with Friends and Family: Once a week    Attends Religious Services: More than 4 times per year    Active Member of [employer] or Organizations: Yes    Attends Engineer, structural: More than 4 times per year    Marital Status: Never married     Family History: The patient's family history includes Hypertension in his father and mother.  ROS:   Please see the history of present illness.     All other systems reviewed and are negative.  EKGs/Labs/Other Studies Reviewed:        Recent Labs: No results found for requested labs within last 365 days.  Recent Lipid Panel    Component Value Date/Time   CHOL 197 03/11/2022 0909   TRIG 41 03/11/2022 0909   HDL 77 03/11/2022 0909   CHOLHDL 2.6 03/11/2022 0909   CHOLHDL 3 09/21/2021 0845   VLDL 11.6 09/21/2021 0845   LDLCALC 112 (H) 03/11/2022 0909    Physical Exam:     Physical Exam: Blood pressure 110/78, pulse 64, height 5' 6.75" (1.695 m), weight 188 lb (85.3 kg), SpO2 99%.       GEN:  very muscular young man in no acute distress HEENT: Normal NECK: No JVD; No carotid bruits LYMPHATICS: No lymphadenopathy CARDIAC: RRR , no murmurs, rubs, gallops RESPIRATORY:  Clear to auscultation without rales, wheezing or rhonchi  ABDOMEN: Soft, non-tender, non-distended MUSCULOSKELETAL:  No edema; No deformity  SKIN: Warm and dry NEUROLOGIC:  Alert and oriented x 3    EKG:     EKG  Interpretation Date/Time:  Tuesday May 02 2023 08:17:07 EST Ventricular Rate:  60 PR Interval:  238 QRS Duration:  98 QT Interval:  376 QTC Calculation: 376 R Axis:   65  Text Interpretation: Sinus rhythm with 1st degree A-V block Early repolarization No previous ECGs available Confirmed by Kristeen Miss ([id number]) on 05/02/2023 8:38:36 AM     ASSESSMENT:    1. Hypertension,  unspecified type   2. Hyperlipidemia, unspecified hyperlipidemia type      PLAN:      History of hypertension:   BP looks great  I've asked him to gradually switch from heavy weightlifting type workouts to more cardio workouts.  This may help prevent hypertension in the future.  I have told him that this will result in a less bulky physique but perhaps more healthy in the future.      2.  Hyperlipidemia:.    Has had borderline hyperlipidemia.  He eats a very healthy diet.  He he continues on fish oil.  Will check lipids, ALT, basic metabolic profile today.     Medication Adjustments/Labs and Tests Ordered: Current medicines are reviewed at length with the patient today.  Concerns regarding medicines are outlined above.  Orders Placed This Encounter  Procedures   Lipid panel   ALT   Basic metabolic panel   EKG 12-Lead   No orders of the defined types were placed in this encounter.    Patient Instructions  Lab Work: Lipids, ALT, BMET today If you have labs (blood work) drawn today and your tests are completely normal, you will receive your results only by: MyChart Message (if you have MyChart) OR A paper copy in the mail If you have any lab test that is abnormal or we need to change your treatment, we will call you to review the results.  Follow-Up: At [facility], you and your health needs are our priority.  As part of our continuing mission to provide you with exceptional heart care, we have created designated Provider Care Teams.  These Care Teams include your primary Cardiologist  (physician) and Advanced Practice Providers (APPs -  Physician Assistants and Nurse Practitioners) who all work together to provide you with the care you need, when you need it.  Your next appointment:   1 year(s)  Provider:   Kristeen Miss, MD   1st Floor: - Lobby - Registration  - Pharmacy  - Lab - Cafe  2nd Floor: - PV Lab - Diagnostic Testing (echo, CT, nuclear med)  3rd Floor: - Vacant  4th Floor: - TCTS (cardiothoracic surgery) - AFib Clinic - Structural Heart Clinic - Vascular Surgery  - Vascular Ultrasound  5th Floor: - HeartCare Cardiology (general and EP) - Clinical Pharmacy for coumadin, hypertension, lipid, weight-loss medications, and med management appointments    Valet parking services will be available as well.     Signed, Kristeen Miss, MD  05/02/2023 9:05 AM    [facility]

## 2023-05-02 ENCOUNTER — Ambulatory Visit: Payer: Commercial Managed Care - PPO | Attending: Cardiovascular Disease | Admitting: Cardiovascular Disease

## 2023-05-02 ENCOUNTER — Encounter: Payer: Self-pay | Admitting: Cardiovascular Disease

## 2023-05-02 VITALS — BP 110/78 | HR 64 | Ht 66.75 in | Wt 188.0 lb

## 2023-05-02 DIAGNOSIS — I10 Essential (primary) hypertension: Secondary | ICD-10-CM | POA: Diagnosis not present

## 2023-05-02 DIAGNOSIS — E785 Hyperlipidemia, unspecified: Secondary | ICD-10-CM

## 2023-05-02 DIAGNOSIS — F4320 Adjustment disorder, unspecified: Secondary | ICD-10-CM | POA: Diagnosis not present

## 2023-05-02 LAB — BASIC METABOLIC PANEL
BUN/Creatinine Ratio: 13 (ref 9–20)
BUN: 16 mg/dL (ref 6–20)
CO2: 23 mmol/L (ref 20–29)
Calcium: 10 mg/dL (ref 8.7–10.2)
Chloride: 103 mmol/L (ref 96–106)
Creatinine, Ser: 1.28 mg/dL — ABNORMAL HIGH (ref 0.76–1.27)
Glucose: 80 mg/dL (ref 70–99)
Potassium: 4.5 mmol/L (ref 3.5–5.2)
Sodium: 145 mmol/L — ABNORMAL HIGH (ref 134–144)
eGFR: 75 mL/min/{1.73_m2} (ref >59)

## 2023-05-02 LAB — LIPID PANEL
Chol/HDL Ratio: 3 ratio (ref 0.0–5.0)
Cholesterol, Total: 201 mg/dL — ABNORMAL HIGH (ref 100–199)
HDL: 68 mg/dL (ref >39)
LDL Chol Calc (NIH): 122 mg/dL — ABNORMAL HIGH (ref 0–99)
Triglycerides: 63 mg/dL (ref 0–149)
VLDL Cholesterol Cal: 11 mg/dL (ref 5–40)

## 2023-05-02 LAB — ALT: ALT: 18 IU/L (ref 0–44)

## 2023-05-02 NOTE — Patient Instructions (Signed)
 Lab Work: Lipids, ALT, BMET today If you have labs (blood work) drawn today and your tests are completely normal, you will receive your results only by: MyChart Message (if you have MyChart) OR A paper copy in the mail If you have any lab test that is abnormal or we need to change your treatment, we will call you to review the results.  Follow-Up: At [facility], you and your health needs are our priority.  As part of our continuing mission to provide you with exceptional heart care, we have created designated Provider Care Teams.  These Care Teams include your primary Cardiologist (physician) and Advanced Practice Providers (APPs -  Physician Assistants and Nurse Practitioners) who all work together to provide you with the care you need, when you need it.  Your next appointment:   1 year(s)  Provider:   Kristeen Miss, MD      1st Floor: - Lobby - Registration  - Pharmacy  - Lab - Cafe  2nd Floor: - PV Lab - Diagnostic Testing (echo, CT, nuclear med)  3rd Floor: - Vacant  4th Floor: - TCTS (cardiothoracic surgery) - AFib Clinic - Structural Heart Clinic - Vascular Surgery  - Vascular Ultrasound  5th Floor: - HeartCare Cardiology (general and EP) - Clinical Pharmacy for coumadin, hypertension, lipid, weight-loss medications, and med management appointments    Valet parking services will be available as well.

## 2023-05-03 ENCOUNTER — Encounter: Payer: Self-pay | Admitting: Cardiovascular Disease

## 2023-05-04 ENCOUNTER — Encounter: Payer: Self-pay | Admitting: Cardiovascular Disease

## 2023-05-04 ENCOUNTER — Telehealth: Payer: Self-pay

## 2023-05-04 ENCOUNTER — Other Ambulatory Visit (HOSPITAL_COMMUNITY): Payer: Self-pay

## 2023-05-04 DIAGNOSIS — E785 Hyperlipidemia, unspecified: Secondary | ICD-10-CM

## 2023-05-04 DIAGNOSIS — Z79899 Other long term (current) drug therapy: Secondary | ICD-10-CM

## 2023-05-04 MED ORDER — ROSUVASTATIN CALCIUM 5 MG PO TABS
5.0000 mg | ORAL_TABLET | ORAL | 3 refills | Status: AC
Start: 2023-05-05 — End: ?
  Filled 2023-05-04: qty 36, 84d supply, fill #0
  Filled 2023-07-12: qty 36, 84d supply, fill #1
  Filled 2023-10-03: qty 36, 84d supply, fill #2
  Filled 2024-01-26: qty 36, 84d supply, fill #3

## 2023-05-04 NOTE — Telephone Encounter (Signed)
-----   Message from Kristeen Miss sent at 05/04/2023  8:00 AM EST ----- Creatinine is slightly elevated .  We discussed this at the office visit.  He may have been slightly volume depleted.    He will need to discuss this further with his primary MD.  We do not have him on any meds that would affect his renal function Sodium is slightly elevated.   This is likely due to dehydration  ATL is normal   Lipids: Total chol and LDL are slightly elevated .   He has a family history of premature CAD.   Would he consider adding Rosuvastatin 5 mg a day or perhaps 3 days a week.  If he decides to take the rosuvastatin, lets check lipids , ALT in 3 months .   Continue a healthy diet ,

## 2023-05-04 NOTE — Telephone Encounter (Signed)
 Called and spoke with patient who requests lab results be sent to PCP. He agrees to plan to trial Crestor 5mg  three times weekly and do labs in 3 months. Orders entered and released.

## 2023-05-09 DIAGNOSIS — E7800 Pure hypercholesterolemia, unspecified: Secondary | ICD-10-CM | POA: Diagnosis not present

## 2023-05-09 DIAGNOSIS — Z713 Dietary counseling and surveillance: Secondary | ICD-10-CM | POA: Diagnosis not present

## 2023-05-14 IMAGING — DX DG KNEE COMPLETE 4+V*R*
4 series · 4 of 4 positions shown · non-contrast
Comparison: None.

CLINICAL DATA: Chronic bilateral knee pain.

EXAM:
RIGHT KNEE - COMPLETE 4+ VIEW

[knee lat]
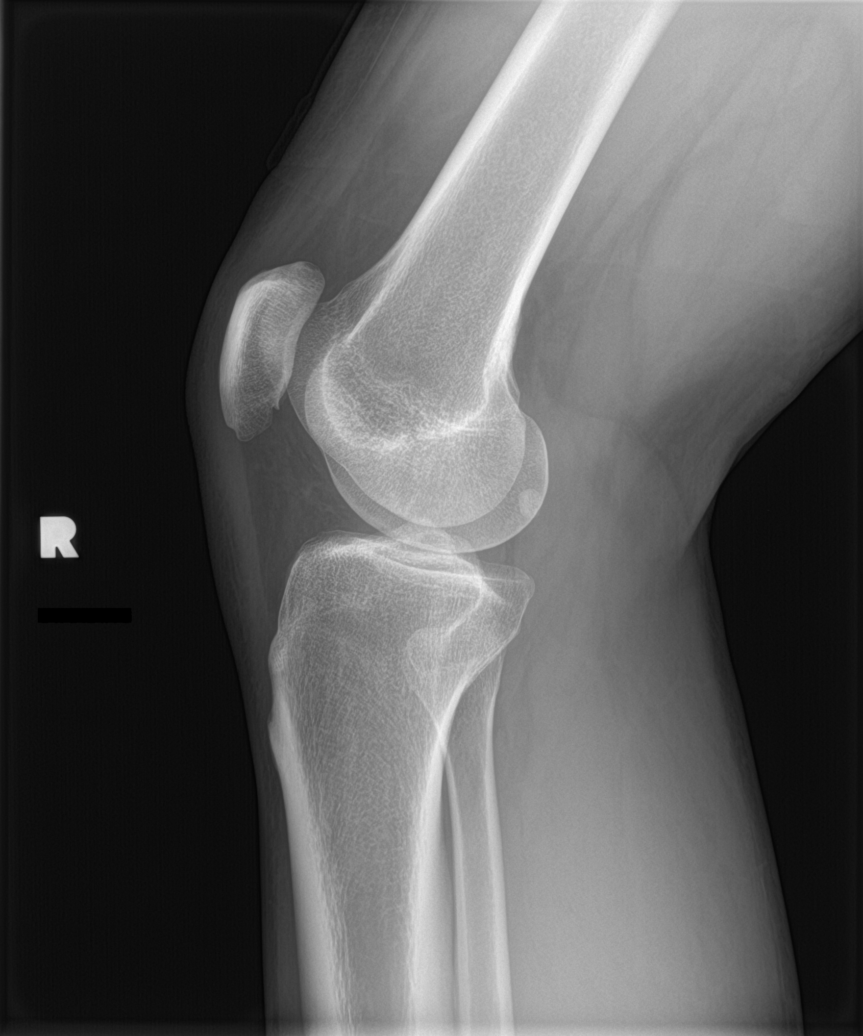

[knee ap bilat standing (1 of 2)]
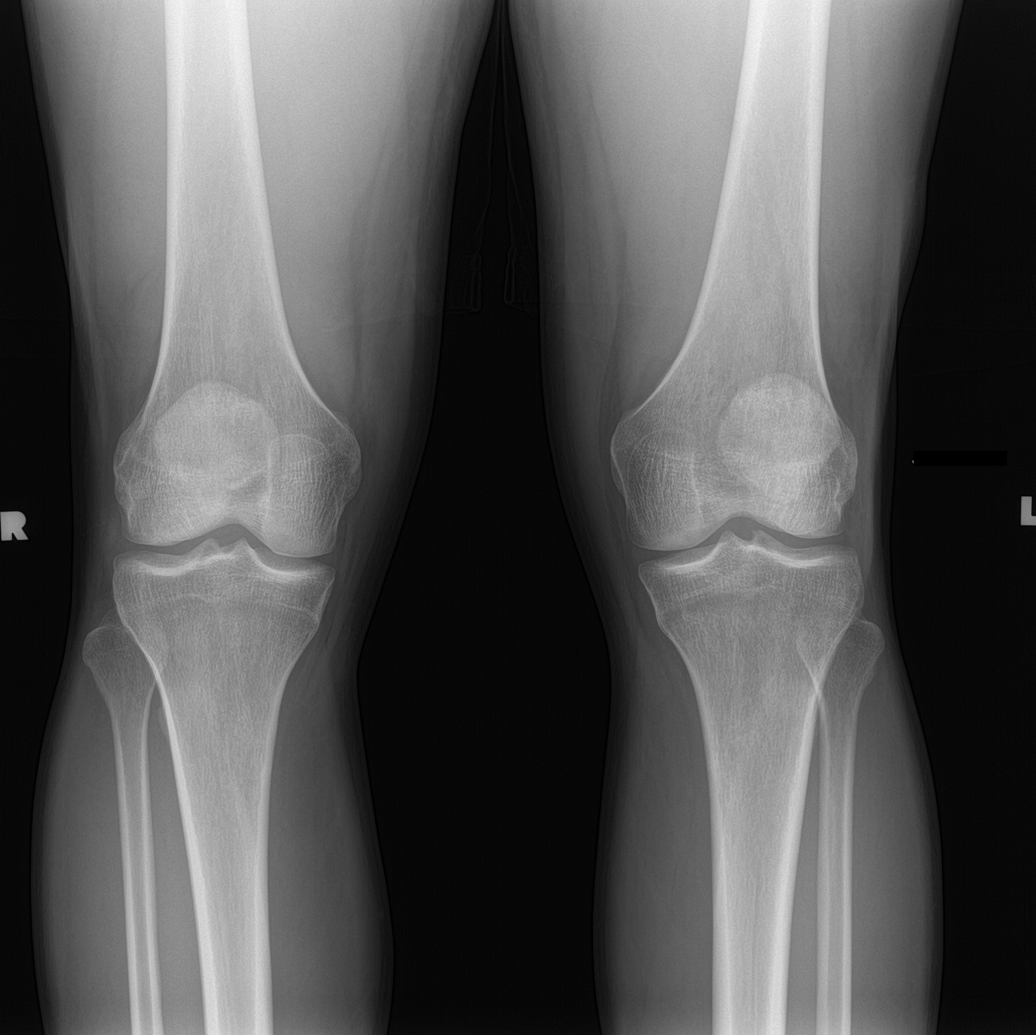

[knee sunrise standing]
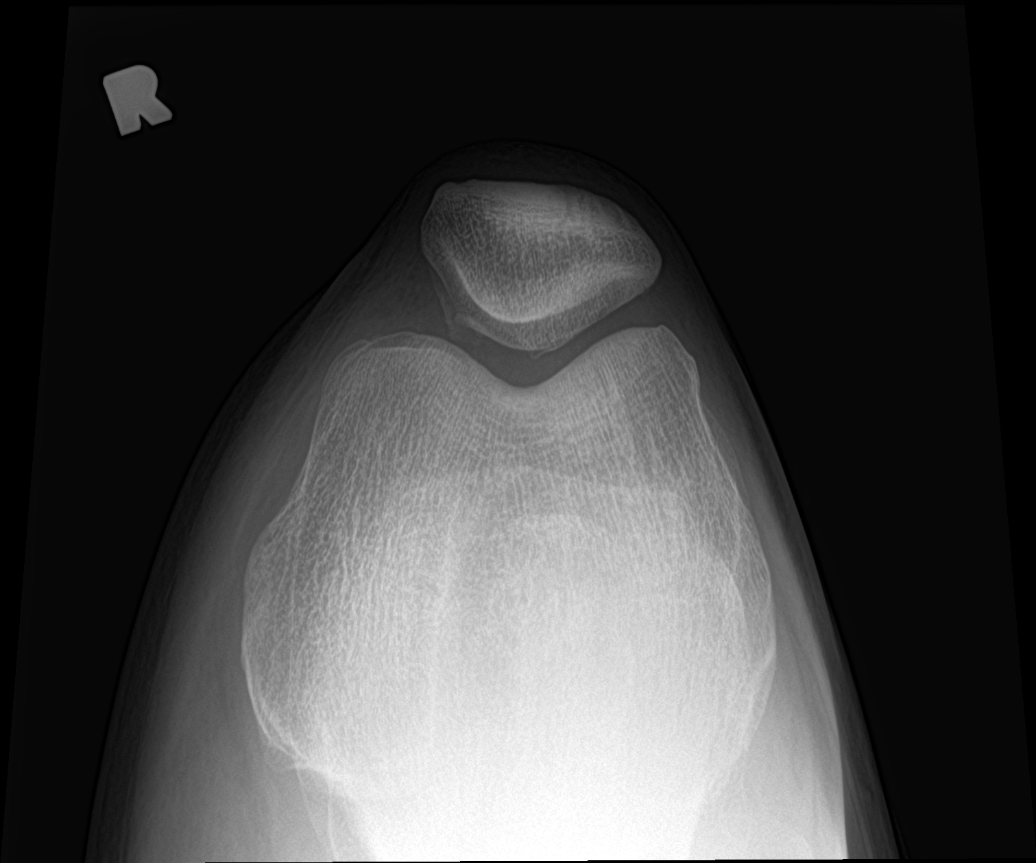

[knee ap bilat standing (2 of 2)]
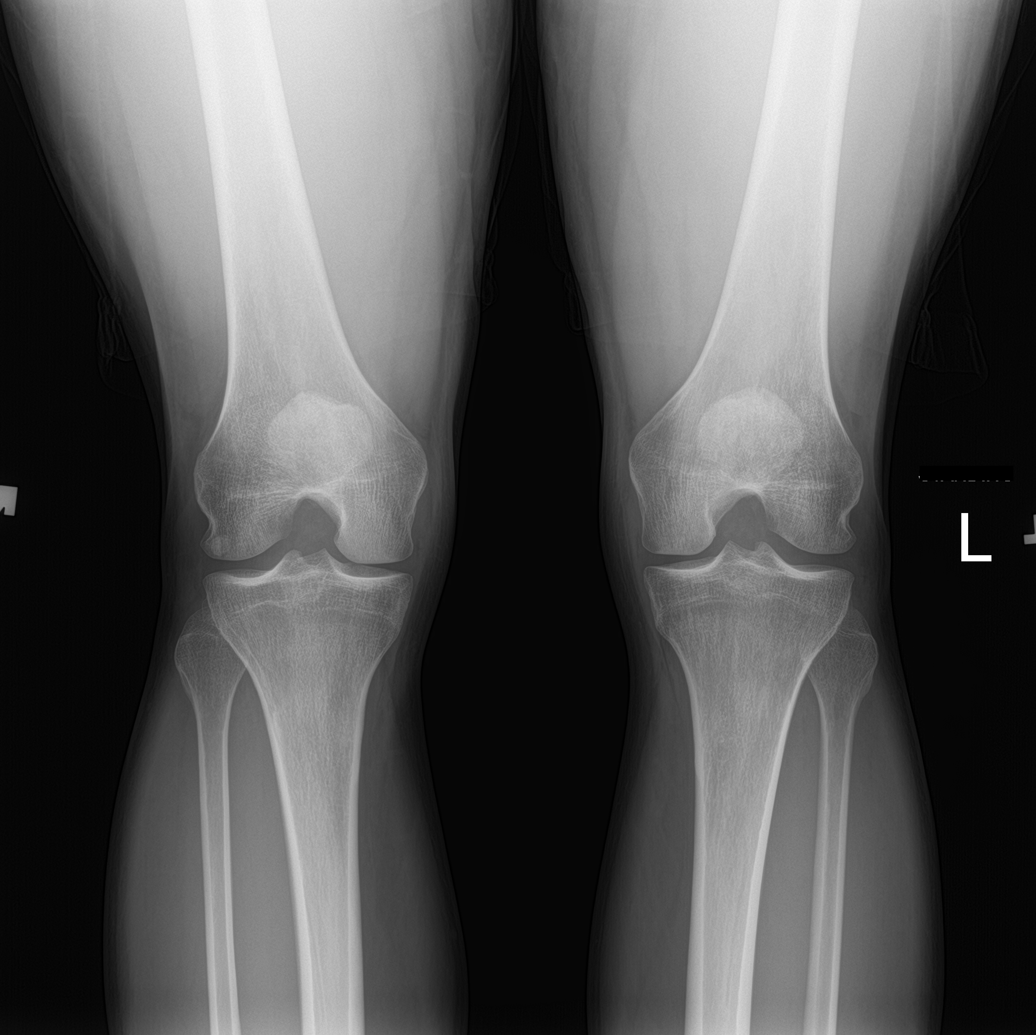

[4 of 4 positions shown; findings below may reference images not displayed]

FINDINGS: No evidence of fracture, dislocation, or joint effusion. No evidence
of arthropathy or other focal bone abnormality. Tiny spur over the
inferior patella. Soft tissues are unremarkable.
IMPRESSION: No acute findings.

## 2023-05-19 ENCOUNTER — Encounter (HOSPITAL_COMMUNITY): Payer: Self-pay

## 2023-05-19 ENCOUNTER — Other Ambulatory Visit (HOSPITAL_COMMUNITY): Payer: Self-pay

## 2023-05-20 DIAGNOSIS — H52223 Regular astigmatism, bilateral: Secondary | ICD-10-CM | POA: Diagnosis not present

## 2023-05-24 ENCOUNTER — Other Ambulatory Visit: Payer: Self-pay

## 2023-05-24 ENCOUNTER — Encounter: Payer: Self-pay | Admitting: Pharmacist

## 2023-05-24 ENCOUNTER — Other Ambulatory Visit (HOSPITAL_COMMUNITY): Payer: Self-pay

## 2023-05-31 DIAGNOSIS — F4320 Adjustment disorder, unspecified: Secondary | ICD-10-CM | POA: Diagnosis not present

## 2023-07-04 DIAGNOSIS — F4320 Adjustment disorder, unspecified: Secondary | ICD-10-CM | POA: Diagnosis not present

## 2023-08-01 DIAGNOSIS — F4320 Adjustment disorder, unspecified: Secondary | ICD-10-CM | POA: Diagnosis not present

## 2023-08-06 DIAGNOSIS — M25562 Pain in left knee: Secondary | ICD-10-CM | POA: Diagnosis not present

## 2023-08-09 ENCOUNTER — Other Ambulatory Visit (HOSPITAL_COMMUNITY): Payer: Self-pay

## 2023-08-09 DIAGNOSIS — M25562 Pain in left knee: Secondary | ICD-10-CM | POA: Diagnosis not present

## 2023-08-15 ENCOUNTER — Telehealth: Payer: Self-pay | Admitting: Cardiovascular Disease

## 2023-08-15 ENCOUNTER — Ambulatory Visit (INDEPENDENT_AMBULATORY_CARE_PROVIDER_SITE_OTHER): Admitting: Family Medicine

## 2023-08-15 VITALS — BP 100/67 | HR 54 | Temp 98.1°F | Resp 18 | Ht 66.75 in | Wt 184.6 lb

## 2023-08-15 DIAGNOSIS — Z13220 Encounter for screening for lipoid disorders: Secondary | ICD-10-CM | POA: Diagnosis not present

## 2023-08-15 DIAGNOSIS — Z0000 Encounter for general adult medical examination without abnormal findings: Secondary | ICD-10-CM

## 2023-08-15 DIAGNOSIS — Z136 Encounter for screening for cardiovascular disorders: Secondary | ICD-10-CM

## 2023-08-15 DIAGNOSIS — R7302 Impaired glucose tolerance (oral): Secondary | ICD-10-CM | POA: Diagnosis not present

## 2023-08-15 NOTE — Progress Notes (Signed)
 Complete physical exam  Patient: Joseph Santiago   DOB: November 22, 1985   38 y.o. Male  MRN: [id number]  Subjective:    Chief Complaint  Patient presents with   Annual Exam    Patient is here for his annual physical . Patient states that he is fasting    Joseph Santiago is a 38 y.o. male who presents today for a complete physical exam. He reports consuming a pescatarian diet. An hour 5 days a week cardio He generally feels well. He reports sleeping fairly well. He does not have additional problems to discuss today.    Most recent fall risk assessment:    03/11/2022    8:30 AM  Fall Risk   Falls in the past year? 0  Number falls in past yr: 0  Injury with Fall? 0  Risk for fall due to : No Fall Risks  Follow up Falls evaluation completed      Data saved with a previous flowsheet row definition     Most recent depression screenings:    03/11/2022    8:30 AM 09/20/2021    4:06 PM  PHQ 2/9 Scores  PHQ - 2 Score 0 1  PHQ- 9 Score 10 11    Vision:Within last year  Patient Active Problem List   Diagnosis Date Noted   Traumatic arthropathy of acromioclavicular joint, left 03/09/2023   Allergic rhinitis 03/09/2022   Exposure to COVID-19 virus 09/20/2021   Chondromalacia of patellofemoral joint 02/17/2021   Weight loss 07/08/2020   Fatigue 07/08/2020   Nausea 07/08/2020   BMI 30.0-30.9,adult    Hypertension    Annual physical exam 07/22/2019   Hyperlipidemia 02/15/2019   Anxiety about health 12/29/2018   Epidermoid cyst 12/26/2018   Hyperlipemia 11/30/2018   Epididymal cyst 11/28/2018   Nocturnal polyuria 11/28/2018   Past Medical History:  Diagnosis Date   BMI 30.0-30.9,adult    Hypertension    Past Surgical History:  Procedure Laterality Date   WISDOM TOOTH EXTRACTION     Social History   Socioeconomic History   Marital status: Single    Spouse name: Not on file   Number of children: Not on file   Years of education: Not on file   Highest education level:  Master's degree (e.g., MA, MS, MEng, MEd, MSW, MBA)  Occupational History    Employer: [employer]   Occupation: Higher education careers adviser with [employer]  Tobacco Use   Smoking status: Never    Passive exposure: Never   Smokeless tobacco: Never  Vaping Use   Vaping status: Never Used  Substance and Sexual Activity   Alcohol use: No   Drug use: No   Sexual activity: Yes    Birth control/protection: Condom  Other Topics Concern   Not on file  Social History Narrative   Not on file   Social Drivers of Health   Financial Resource Strain: Low Risk  (08/15/2023)   Overall Financial Resource Strain (CARDIA)    Difficulty of Paying Living Expenses: Not hard at all  Food Insecurity: No Food Insecurity (08/15/2023)   Hunger Vital Sign    Worried About Running Out of Food in the Last Year: Never true    Ran Out of Food in the Last Year: Never true  Transportation Needs: No Transportation Needs (08/15/2023)   PRAPARE - Administrator, Civil Service (Medical): No    Lack of Transportation (Non-Medical): No  Physical Activity: Sufficiently Active (08/15/2023)  Exercise Vital Sign    Days of Exercise per Week: 5 days    Minutes of Exercise per Session: 90 min  Stress: No Stress Concern Present (08/15/2023)   [employer] of Occupational Health - Occupational Stress Questionnaire    Feeling of Stress: Not at all  Social Connections: Moderately Integrated (08/15/2023)   Social Connection and Isolation Panel    Frequency of Communication with Friends and Family: More than three times a week    Frequency of Social Gatherings with Friends and Family: Once a week    Attends Religious Services: 1 to 4 times per year    Active Member of [employer] or Organizations: Yes    Attends Banker Meetings: 1 to 4 times per year    Marital Status: Never married  Intimate Partner Violence: Not At Risk (08/13/2022)   Humiliation, Afraid, Rape, and Kick questionnaire     Fear of Current or Ex-Partner: No    Emotionally Abused: No    Physically Abused: No    Sexually Abused: No   Family History  Problem Relation Age of Onset   Hypertension Mother    Hypertension Father    Allergies  Allergen Reactions   Bactrim [Sulfamethoxazole-Trimethoprim] Hives and Swelling    Throat closes up   Dust Mite Extract    Other     Roaches       Patient Care Team: Manette Section, MD as PCP - General (Family Medicine) Nahser, Lela Purple, MD as PCP - Cardiology (Cardiology)   Outpatient Medications Prior to Visit  Medication Sig   tretinoin  (RETIN-A ) 0.025 % cream Apply a dime-sized amount to entire face nightly as tolerated.   meloxicam  (MOBIC ) 15 MG tablet One tab PO every 24 hours with a meal for 1 week, then once every 24 hours prn pain. (Patient not taking: Reported on 05/02/2023)   Omega-3 Fatty Acids (FISH OIL) 1000 MG CAPS Take by mouth. (Patient not taking: Reported on 05/02/2023)   rosuvastatin  (CRESTOR ) 5 MG tablet Take 1 tablet (5 mg total) by mouth 3 (three) times a week. (Patient not taking: Reported on 08/15/2023)   No facility-administered medications prior to visit.    Review of Systems  All other systems reviewed and are negative.         Objective:     BP 100/67   Pulse (!) 54   Temp 98.1 F (36.7 C) (Oral)   Resp 18   Ht 5' 6.75 (1.695 m)   Wt 184 lb 9.6 oz (83.7 kg)   SpO2 99%   BMI 29.13 kg/m  BP Readings from Last 3 Encounters:  08/15/23 100/67  05/02/23 110/78  03/23/23 94/61      Physical Exam Vitals and nursing note reviewed.  Constitutional:      Appearance: Normal appearance. He is normal weight.  HENT:     Head: Normocephalic and atraumatic.     Right Ear: Tympanic membrane, ear canal and external ear normal.     Left Ear: Tympanic membrane, ear canal and external ear normal.     Nose: Nose normal.     Mouth/Throat:     Mouth: Mucous membranes are moist.     Pharynx: Oropharynx is clear.   Eyes:      Conjunctiva/sclera: Conjunctivae normal.     Pupils: Pupils are equal, round, and reactive to light.    Cardiovascular:     Rate and Rhythm: Normal rate and regular rhythm.     Pulses: Normal pulses.  Heart sounds: Normal heart sounds.  Pulmonary:     Effort: Pulmonary effort is normal.     Breath sounds: Normal breath sounds.  Abdominal:     General: Abdomen is flat. Bowel sounds are normal.   Skin:    General: Skin is warm.     Capillary Refill: Capillary refill takes less than 2 seconds.   Neurological:     General: No focal deficit present.     Mental Status: He is alert and oriented to person, place, and time. Mental status is at baseline.   Psychiatric:        Mood and Affect: Mood normal.        Behavior: Behavior normal.        Thought Content: Thought content normal.        Judgment: Judgment normal.     No results found for any visits on 08/15/23. Last CBC Lab Results  Component Value Date   WBC 2.6 (L) 06/25/2020   HGB 14.7 06/25/2020   HCT 41.9 06/25/2020   MCV 90.9 06/25/2020   MCH 31.9 06/25/2020   RDW 11.6 06/25/2020   PLT 196 06/25/2020   Last metabolic panel Lab Results  Component Value Date   GLUCOSE 80 05/02/2023   NA 145 (H) 05/02/2023   K 4.5 05/02/2023   CL 103 05/02/2023   CO2 23 05/02/2023   BUN 16 05/02/2023   CREATININE 1.28 (H) 05/02/2023   EGFR 75 05/02/2023   CALCIUM  10.0 05/02/2023   PROT 7.2 09/21/2021   ALBUMIN 4.4 09/21/2021   BILITOT 1.1 09/21/2021   ALKPHOS 49 09/21/2021   AST 20 09/21/2021   ALT 18 05/02/2023   ANIONGAP 5 06/25/2020   Last lipids Lab Results  Component Value Date   CHOL 201 (H) 05/02/2023   HDL 68 05/02/2023   LDLCALC 122 (H) 05/02/2023   TRIG 63 05/02/2023   CHOLHDL 3.0 05/02/2023   Last hemoglobin A1c Lab Results  Component Value Date   HGBA1C 5.4 09/21/2021        Assessment & Plan:    Routine Health Maintenance and Physical Exam  Immunization History  Administered Date(s)  Administered   Hep A / Hep B 09/24/2021   Influenza, High Dose Seasonal PF 04/29/2020   Influenza, Seasonal, Injecte, Preservative Fre 12/15/2022   Influenza-Unspecified 01/12/2022   PFIZER(Purple Top)SARS-COV-2 Vaccination 03/19/2019, 04/09/2019, 12/18/2019, 04/29/2020   Pfizer(Comirnaty)Fall Seasonal Vaccine 12 years and older 09/24/2021, 12/15/2022   Tdap 12/13/2011, 09/24/2021    Health Maintenance  Topic Date Due   HPV VACCINES (1 - Male 3-dose series) Never done   COVID-19 Vaccine (7 - Pfizer risk 2024-25 season) 06/15/2023   INFLUENZA VACCINE  09/29/2023   DTaP/Tdap/Td (3 - Td or Tdap) 09/25/2031   Hepatitis C Screening  Completed   HIV Screening  Completed   Meningococcal B Vaccine  Aged Out    Discussed health benefits of physical activity, and encouraged him to engage in regular exercise appropriate for his age and condition.  Problem List Items Addressed This Visit   None  No follow-ups on file. Annual physical exam  Encounter for lipid screening for cardiovascular disease -     Lipid panel  Impaired glucose tolerance -     CBC with Differential/Platelet -     Comprehensive metabolic panel with GFR -     Hemoglobin A1c   Screening labs  See in 1 year sooner prn    Manette Section, MD

## 2023-08-15 NOTE — Telephone Encounter (Signed)
 Patient is calling because he had labs completed and scheduled a 94m f/u appt. Patient stated he had labs completed today, but mentioned he stopped taking the medication. Please advise.

## 2023-08-15 NOTE — Telephone Encounter (Signed)
 Error

## 2023-08-15 NOTE — Telephone Encounter (Signed)
 Patient called to report he had labs drawn today (ordered by PCP), but he had stopped taking rosuvastatin .  He states the medication works because after he started it he had his lipids checked at [facility] on 06/24/23 and his LDL was 53.  Patient states the reason he stopped taking the rosuvastatin  is because he was experiencing brain fog and loss of words. He reports since stopping rosuvastatin  his mind has been more clear.  Patient would like to know what other options are available to him for cholesterol management.  Patient has been seeing Dr. Alroy Aspen, but has scheduled F/U appt with Dr. Lavonne Prairie to continue with upon Dr. Letta Raw retirement.  Appt is scheduled for this week 6/19 with Dr. Lavonne Prairie.

## 2023-08-16 DIAGNOSIS — M25519 Pain in unspecified shoulder: Secondary | ICD-10-CM | POA: Diagnosis not present

## 2023-08-16 LAB — CBC WITH DIFFERENTIAL/PLATELET
Basophils Absolute: 0 10*3/uL (ref 0.0–0.2)
Basos: 1 %
EOS (ABSOLUTE): 0.1 10*3/uL (ref 0.0–0.4)
Eos: 2 %
Hematocrit: 47.4 % (ref 37.5–51.0)
Hemoglobin: 16.1 g/dL (ref 13.0–17.7)
Immature Grans (Abs): 0 10*3/uL (ref 0.0–0.1)
Immature Granulocytes: 0 %
Lymphocytes Absolute: 1.5 10*3/uL (ref 0.7–3.1)
Lymphs: 54 %
MCH: 32.8 pg (ref 26.6–33.0)
MCHC: 34 g/dL (ref 31.5–35.7)
MCV: 97 fL (ref 79–97)
Monocytes Absolute: 0.4 10*3/uL (ref 0.1–0.9)
Monocytes: 14 %
Neutrophils Absolute: 0.8 10*3/uL — ABNORMAL LOW (ref 1.4–7.0)
Neutrophils: 29 %
Platelets: 157 10*3/uL (ref 150–450)
RBC: 4.91 x10E6/uL (ref 4.14–5.80)
RDW: 12.1 % (ref 11.6–15.4)
WBC: 2.7 10*3/uL — ABNORMAL LOW (ref 3.4–10.8)

## 2023-08-16 LAB — HEMOGLOBIN A1C
Est. average glucose Bld gHb Est-mCnc: 108 mg/dL
Hgb A1c MFr Bld: 5.4 % (ref 4.8–5.6)

## 2023-08-16 LAB — COMPREHENSIVE METABOLIC PANEL WITH GFR
ALT: 23 IU/L (ref 0–44)
AST: 21 IU/L (ref 0–40)
Albumin: 4.6 g/dL (ref 4.1–5.1)
Alkaline Phosphatase: 64 IU/L (ref 44–121)
BUN/Creatinine Ratio: 10 (ref 9–20)
BUN: 13 mg/dL (ref 6–20)
Bilirubin Total: 1.1 mg/dL (ref 0.0–1.2)
CO2: 23 mmol/L (ref 20–29)
Calcium: 9.9 mg/dL (ref 8.7–10.2)
Chloride: 100 mmol/L (ref 96–106)
Creatinine, Ser: 1.3 mg/dL — ABNORMAL HIGH (ref 0.76–1.27)
Globulin, Total: 3.2 g/dL (ref 1.5–4.5)
Glucose: 82 mg/dL (ref 70–99)
Potassium: 4.7 mmol/L (ref 3.5–5.2)
Sodium: 139 mmol/L (ref 134–144)
Total Protein: 7.8 g/dL (ref 6.0–8.5)
eGFR: 73 mL/min/{1.73_m2} (ref >59)

## 2023-08-16 LAB — LIPID PANEL
Chol/HDL Ratio: 2.9 ratio (ref 0.0–5.0)
Cholesterol, Total: 175 mg/dL (ref 100–199)
HDL: 61 mg/dL (ref >39)
LDL Chol Calc (NIH): 104 mg/dL — ABNORMAL HIGH (ref 0–99)
Triglycerides: 52 mg/dL (ref 0–149)
VLDL Cholesterol Cal: 10 mg/dL (ref 5–40)

## 2023-08-16 NOTE — Telephone Encounter (Signed)
 Patient has appt with Dr. Lavonne Prairie 08/17/23. Referral to [facility] ordered. Can discuss at office visit.

## 2023-08-16 NOTE — Addendum Note (Signed)
 Addended by: Luwana Salvo on: 08/16/2023 05:54 PM   Modules accepted: Orders

## 2023-08-16 NOTE — Progress Notes (Signed)
  Cardiology Office Note:   Date:  08/17/2023  ID:  Joseph Santiago, DOB 08/12/1985, MRN [id number] PCP: Manette Section, MD  [facility] Providers Cardiologist:  Ahmad Alert, MD {  History of Present Illness:   Joseph Santiago is a 38 y.o. male who previously saw Dr. Alroy Aspen for HTN and dyslipidemia.  He wanted to come back and talk about whether he should take a statin.  He says he felt a little foggy even taking it and took himself off but has been taking it 3 times a week.  He did have a good response with his LDL going from 1 22-55.  However, he has had no coronary disease.  I do see a few years ago he had a CT to rule out pulmonary embolism and there was no evidence of any vascular disease.  He exercises routinely and vigorously. The patient denies any new symptoms such as chest discomfort, neck or arm discomfort. There has been no new shortness of breath, PND or orthopnea. There have been no reported palpitations, presyncope or syncope.    ROS: Positive for knee pain.  Otherwise as stated in HPI negative for all other systems.  Studies Reviewed:    EKG:     Normal sinus rhythm, rate 60, axis within normal limits, intervals within normal limits, no acute ST-T wave changes.  Risk Assessment/Calculations:              Physical Exam:   VS:  BP 127/79   Pulse 66   Ht 5' 6.75 (1.695 m)   Wt 184 lb 12.8 oz (83.8 kg)   SpO2 96%   BMI 29.16 kg/m    Wt Readings from Last 3 Encounters:  08/17/23 184 lb 12.8 oz (83.8 kg)  08/15/23 184 lb 9.6 oz (83.7 kg)  05/02/23 188 lb (85.3 kg)     GEN: Well nourished, well developed in no acute distress NECK: No JVD; No carotid bruits CARDIAC: RRR, no murmurs, rubs, gallops RESPIRATORY:  Clear to auscultation without rales, wheezing or rhonchi  ABDOMEN: Soft, non-tender, non-distended EXTREMITIES:  No edema; No deformity   ASSESSMENT AND PLAN:    HTN: The patient's blood pressure is at target without meds.  No change in  therapy.  Dyslipidemia: His cardiovascular risk score is very low.  I have suggested that he does not need a statin.  We talked about a Mediterranean plant forward diet.  I would not suggest further testing.  We talked at length about exercise.     Follow up with me as needed  Signed, Eilleen Grates, MD

## 2023-08-17 ENCOUNTER — Ambulatory Visit: Attending: Cardiology | Admitting: Cardiology

## 2023-08-17 ENCOUNTER — Encounter: Payer: Self-pay | Admitting: Cardiology

## 2023-08-17 ENCOUNTER — Ambulatory Visit: Payer: Self-pay | Admitting: Family Medicine

## 2023-08-17 VITALS — BP 127/79 | HR 66 | Ht 66.75 in | Wt 184.8 lb

## 2023-08-17 DIAGNOSIS — E785 Hyperlipidemia, unspecified: Secondary | ICD-10-CM | POA: Diagnosis not present

## 2023-08-17 NOTE — Patient Instructions (Signed)

## 2023-08-22 ENCOUNTER — Other Ambulatory Visit (HOSPITAL_COMMUNITY): Payer: Self-pay

## 2023-08-28 DIAGNOSIS — S8002XA Contusion of left knee, initial encounter: Secondary | ICD-10-CM | POA: Diagnosis not present

## 2023-08-29 DIAGNOSIS — F4320 Adjustment disorder, unspecified: Secondary | ICD-10-CM | POA: Diagnosis not present

## 2023-08-31 ENCOUNTER — Encounter: Admitting: Family Medicine

## 2023-09-13 DIAGNOSIS — R531 Weakness: Secondary | ICD-10-CM | POA: Diagnosis not present

## 2023-09-13 DIAGNOSIS — M25512 Pain in left shoulder: Secondary | ICD-10-CM | POA: Diagnosis not present

## 2023-09-15 ENCOUNTER — Other Ambulatory Visit (HOSPITAL_COMMUNITY): Payer: Self-pay

## 2023-09-19 DIAGNOSIS — R531 Weakness: Secondary | ICD-10-CM | POA: Diagnosis not present

## 2023-09-19 DIAGNOSIS — M25512 Pain in left shoulder: Secondary | ICD-10-CM | POA: Diagnosis not present

## 2023-09-27 DIAGNOSIS — M25512 Pain in left shoulder: Secondary | ICD-10-CM | POA: Diagnosis not present

## 2023-09-27 DIAGNOSIS — R531 Weakness: Secondary | ICD-10-CM | POA: Diagnosis not present

## 2023-10-02 DIAGNOSIS — F4320 Adjustment disorder, unspecified: Secondary | ICD-10-CM | POA: Diagnosis not present

## 2023-10-02 NOTE — Progress Notes (Unsigned)
 Patient ID: Joseph Santiago                 DOB: [birth year]                    MRN: [id number]      HPI: Joseph Santiago is a 38 y.o. male patient referred to lipid clinic by Dr.Hochrein. PMH is significant for HTN and dyslipidemia    The patient presented to the lipid clinic for follow-up regarding lipid management. He reports currently taking Crestor  (rosuvastatin ) 5 mg two to three times per week, which he has found to be the most tolerable regimen due to experiencing mental fog and confusion with daily dosing. His baseline LDL, when off statins, has consistently been in the 122-125 mg/dL range. While taking Crestor  5 mg three times per week, his most recent LDL was 104 mg/dL. In March 2025, he attempted Crestor  5 mg daily for the first time, but was unable to tolerate this due to cognitive side effects. Labs drawn in April 2025 at [place], during the brief period he was on daily dosing, showed a significantly lower LDL of 53 mg/dL. The patient maintains a healthy diet and exercises five days per week. We discussed his family history, cardiovascular risk factors, and the range of LDL goals based on these risks. After shared decision-making, we agreed to continue the current regimen of Crestor  5 mg three times per week and plan to recheck lipid labs in two months.  He is not on any hypertensive meds, he use to have elevated BP in his teen age   Current Medications: Crestor  5 mg 3 times per week  Intolerances: Crestor  5 mg daily - brain fog  Risk Factors: HTN  LDL goal: <100  Diet:   Exercise: cardio for 20 min 1.30 5 days per week   Family History: great grant father maternal  - throat cancer  Mother side family - uncles - brain tumor and prostate cancer  Relation Problem Comments  Mother Metallurgist) Hypertension      Father Metallurgist) Hypertension, cholesterol      Maternal Grandmother (Alive)   Maternal Grandfather (Deceased) Cancer - lung   Paternal Grandmother (Deceased) MI at age 69     Paternal Grandfather (Deceased)    [place] grand mother -paternal - cancer stomach  Great grand father - maternal Diabetes  Father's brother - MS   Social History:  Alcohol: none  Smoking: none  Labs:  Lipid Panel     Component Value Date/Time   CHOL 175 08/15/2023 1552   TRIG 52 08/15/2023 1552   HDL 61 08/15/2023 1552   CHOLHDL 2.9 08/15/2023 1552   CHOLHDL 3 09/21/2021 0845   VLDL 11.6 09/21/2021 0845   LDLCALC 104 (H) 08/15/2023 1552   LABVLDL 10 08/15/2023 1552    Past Medical History:  Diagnosis Date   BMI 30.0-30.9,adult    Hypertension     Current Outpatient Medications on File Prior to Visit  Medication Sig Dispense Refill   rosuvastatin  (CRESTOR ) 5 MG tablet Take 1 tablet (5 mg total) by mouth 3 (three) times a week. (Patient not taking: Reported on 08/17/2023) 36 tablet 3   tretinoin  (RETIN-A ) 0.025 % cream Apply a dime-sized amount to entire face nightly as tolerated. 45 g 2   No current facility-administered medications on file prior to visit.    Allergies  Allergen Reactions   Bactrim [Sulfamethoxazole-Trimethoprim] Hives and Swelling    Throat closes up   Dust Mite  Extract    Other     Roaches     Assessment/Plan:  1. Hyperlipidemia -  Problem  Hyperlipidemia   Hyperlipidemia Assessment:  currently taking Crestor  (rosuvastatin ) 5 mg 2-3 times per week, which he identifies as his most tolerated regimen due to previous issues with mental fog and confusion on higher dosing. His baseline LDL (off statins) has been in the 122-125 mg/dL range. His most recent LDL of 104 mg/dL corresponds to the period during which he was taking Crestor  5 mg three times per week.  In March 2025, he attempted Crestor  5 mg daily for the first time. However, due to side effects, he discontinued that dose. Labs drawn in April 2025 at [place] during the time he was on daily dosing showed an LDL of 53 mg/dL.  The patient follows a healthy diet and exercises 5 days per week.  We discussed family history and reviewed cardiovascular risk factors in detail, including how these inform LDL goals based on risk stratification.  Plan:  Continue Crestor  5 mg three times per week, which is the most tolerated regimen to date Repeat lipid panel in 2 months to reassess LDL level and therapy effectiveness Continue lifestyle modifications (diet and exercise) Revisit risk-based LDL goal and adjust therapy as needed based on future labs and tolerance    Thank you,  Robbi Blanch, Pharm.D [facility]. [facility] [address], [place], [place] [address] Phone: [phone]; Fax: [phone]

## 2023-10-03 ENCOUNTER — Encounter: Payer: Self-pay | Admitting: Pharmacist

## 2023-10-03 ENCOUNTER — Other Ambulatory Visit (HOSPITAL_COMMUNITY): Payer: Self-pay

## 2023-10-03 ENCOUNTER — Ambulatory Visit: Attending: Cardiovascular Disease | Admitting: Pharmacist

## 2023-10-03 DIAGNOSIS — E7849 Other hyperlipidemia: Secondary | ICD-10-CM | POA: Diagnosis not present

## 2023-10-03 NOTE — Assessment & Plan Note (Signed)
 Assessment:  currently taking Crestor  (rosuvastatin ) 5 mg 2-3 times per week, which he identifies as his most tolerated regimen due to previous issues with mental fog and confusion on higher dosing. His baseline LDL (off statins) has been in the 122-125 mg/dL range. His most recent LDL of 104 mg/dL corresponds to the period during which he was taking Crestor  5 mg three times per week.  In March 2025, he attempted Crestor  5 mg daily for the first time. However, due to side effects, he discontinued that dose. Labs drawn in April 2025 at [place] during the time he was on daily dosing showed an LDL of 53 mg/dL.  The patient follows a healthy diet and exercises 5 days per week. We discussed family history and reviewed cardiovascular risk factors in detail, including how these inform LDL goals based on risk stratification.  Plan:  Continue Crestor  5 mg three times per week, which is the most tolerated regimen to date Repeat lipid panel in 2 months to reassess LDL level and therapy effectiveness Continue lifestyle modifications (diet and exercise) Revisit risk-based LDL goal and adjust therapy as needed based on future labs and tolerance

## 2023-10-04 ENCOUNTER — Encounter: Payer: Self-pay | Admitting: Dietician

## 2023-10-04 ENCOUNTER — Other Ambulatory Visit (HOSPITAL_COMMUNITY): Payer: Self-pay

## 2023-10-04 ENCOUNTER — Encounter: Attending: Family Medicine | Admitting: Dietician

## 2023-10-04 VITALS — Wt 183.9 lb

## 2023-10-04 DIAGNOSIS — E781 Pure hyperglyceridemia: Secondary | ICD-10-CM | POA: Insufficient documentation

## 2023-10-04 NOTE — Patient Instructions (Signed)
 Goals Established by Pt  Goal 1: add 1 T ground flax daily.   Goal 2: add beans/lentils most days  Goal 3: add oats to your diet a few days per week.

## 2023-10-04 NOTE — Progress Notes (Signed)
 Medical Nutrition Therapy  Appointment Start time:  [phone]  Appointment End time:  1705  Primary concerns today: LDL lowering   Referral diagnosis: Employee Visit 1 Preferred learning style: no preference indicated Learning readiness: ready   NUTRITION ASSESSMENT   Anthropometrics   Wt: 183.9 lb  Clinical Medical Hx: reviewed, HTN, HLD Medications: reviewed Labs: reviewed; 08/15/23: LDL 104 Notable Signs/Symptoms: none Food Allergies: none  Lifestyle & Dietary Hx  Pt reports he wants to work on reducing LDL. Pt states he used to have high triglycerides and high blood pressure but made dietary and lifestyle changes to bring these to normal.  Pt states he meal preps. Pt reports he packs lunch for work daily (works [employer]). Pt states he typically eats salmon, brown rice, and non-starchy vegetables. Pt reports he sticks to similar foods because he knows they work well for him. Pt states he cooks with herbs and spices and typically does not use salt.   Pt reports in the past he would sleep 3-4 hours per night and is now sleeping 6-7 hours most nights.  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 Pt reports exercise, faith, and reading help with stress relief. Pt reports he typically works out 5x/wk, a variety of strength (back/bi, chest/tri, legs, etc), with 20 minutes cardio and 10 minutes stretching, or will do a boxing/sprint track workout.   Estimated daily fluid intake: 3L water Supplements: none Sleep: 6-7 hours Stress / self-care: moderate to high Current average weekly physical activity: exercise (stretch, strength rotation, 20 min cardio, abs) 5x/wk, may also do HIIT, boxing, sprint work.   24-Hr Dietary Recall First Meal: 9am: plain greek yogurt with granola, raspberries OR kodiak pancakes and egg whites and fruit Snack: none Second Meal: daves bread PBJ and fruit OR salmon, brown rice, vegetables Snack:  none Third Meal: salmon, brown rice, vegetables OR tuna, brown rice and vegetables Snack: 1/2 PBJ OR fruit Beverages: water, coconut water occasionally, 1 naked juice per week, 1 green tea   NUTRITION DIAGNOSIS  NB-1.1 Food and nutrition-related knowledge deficit As related to lack of prior education by a registered dietitian.  As evidenced by pt report.   NUTRITION INTERVENTION  Nutrition education (E-1) on the following topics:   LDL Lowering Nutrition Therapy Soluble fiber and impact on lowering cholesterol: Sources: Oats, barley, beans, lentils, fruits (apples, oranges), vegetables (carrots, broccoli), and flaxseeds. Benefits: Soluble fiber reduces the absorption of cholesterol into your bloodstream. Choose Healthy Unsaturated Fats and Omega 3's. Sources: Olive oil, canola oil, avocados, nuts, and Fatty fish (salmon, trout), walnuts, flaxseeds, and chia seeds. Benefits: Helps raise HDL cholesterol and lower LDL cholesterol. Omega-3s help reduce triglycerides and raise HDL cholesterol. Limit Saturated Fats: Sources: Red meat, full-fat dairy products, butter, and coconut oil. Benefits: Reducing intake helps lower LDL cholesterol levels. Eat Plenty of Fruits and Vegetables. Benefits: High in fiber and antioxidants, which help lower LDL cholesterol. Eat Whole Grains. Sources: Brown rice, whole wheat bread, quinoa, and whole grain pasta. Benefits: Whole grains contain more fiber and nutrients than refined grains. Regular Physical Activity: Aim for at least 30 minutes of moderate-intensity exercise most days of the week.  Fiber Dietary fiber is essential for health and comes in two types: soluble and insoluble fiber. Soluble Fiber: Characteristics: Dissolves in water, forming a gel-like substance. Sources: Oats, nuts, seeds, beans, lentils, fruits (apples, citrus), and vegetables (carrots). Benefits: Regulates blood sugar, lowers LDL cholesterol,  supports heart health, and aids in digestion by  forming a gel that prevents diarrhea. Insoluble Fiber: Characteristics: Does not dissolve in water and adds bulk to stool. Sources: Whole grains, bran, nuts, seeds, vegetables (cauliflower, green beans), and fruits (apples with skin, berries). Benefits: Promotes regular bowel movements, aids in weight management, and prevents diverticular disease.  Stress and Cholesterol Hormonal Responses: During stress, the body releases hormones like cortisol and adrenaline. These hormones can increase triglyceride levels and promote the production of low-density lipoprotein (LDL) cholesterol, commonly referred to as bad cholesterol.  Handouts Provided Include  Plate Method  Learning Style & Readiness for Change Teaching method utilized: Visual & Auditory  Demonstrated degree of understanding via: Teach Back  Barriers to learning/adherence to lifestyle change: none  Goals Established by Pt  Goal 1: add 1 T ground flax daily.   Goal 2: add beans/lentils most days  Goal 3: add oats to your diet a few days per week.    MONITORING & EVALUATION Dietary intake, weekly physical activity, and follow up in 2 months.  Next Steps  Patient is to call for questions.

## 2023-10-11 DIAGNOSIS — R531 Weakness: Secondary | ICD-10-CM | POA: Diagnosis not present

## 2023-10-11 DIAGNOSIS — M25512 Pain in left shoulder: Secondary | ICD-10-CM | POA: Diagnosis not present

## 2023-10-18 DIAGNOSIS — M25512 Pain in left shoulder: Secondary | ICD-10-CM | POA: Diagnosis not present

## 2023-10-18 DIAGNOSIS — R531 Weakness: Secondary | ICD-10-CM | POA: Diagnosis not present

## 2023-10-23 DIAGNOSIS — R531 Weakness: Secondary | ICD-10-CM | POA: Diagnosis not present

## 2023-10-23 DIAGNOSIS — M25512 Pain in left shoulder: Secondary | ICD-10-CM | POA: Diagnosis not present

## 2023-10-25 DIAGNOSIS — M25512 Pain in left shoulder: Secondary | ICD-10-CM | POA: Diagnosis not present

## 2023-10-25 DIAGNOSIS — R531 Weakness: Secondary | ICD-10-CM | POA: Diagnosis not present

## 2023-10-31 ENCOUNTER — Encounter: Payer: Self-pay | Admitting: Sports Medicine

## 2023-11-06 DIAGNOSIS — F4320 Adjustment disorder, unspecified: Secondary | ICD-10-CM | POA: Diagnosis not present

## 2023-11-07 DIAGNOSIS — M25512 Pain in left shoulder: Secondary | ICD-10-CM | POA: Diagnosis not present

## 2023-11-07 DIAGNOSIS — R531 Weakness: Secondary | ICD-10-CM | POA: Diagnosis not present

## 2023-11-19 ENCOUNTER — Other Ambulatory Visit (HOSPITAL_COMMUNITY): Payer: Self-pay

## 2023-11-20 ENCOUNTER — Encounter (HOSPITAL_COMMUNITY): Payer: Self-pay

## 2023-11-20 ENCOUNTER — Other Ambulatory Visit (HOSPITAL_COMMUNITY): Payer: Self-pay

## 2023-11-22 ENCOUNTER — Other Ambulatory Visit (HOSPITAL_COMMUNITY): Payer: Self-pay

## 2023-12-04 DIAGNOSIS — F4320 Adjustment disorder, unspecified: Secondary | ICD-10-CM | POA: Diagnosis not present

## 2023-12-06 ENCOUNTER — Encounter: Attending: Family Medicine | Admitting: Dietician

## 2023-12-06 ENCOUNTER — Encounter: Payer: Self-pay | Admitting: Dietician

## 2023-12-06 VITALS — Wt 184.5 lb

## 2023-12-06 DIAGNOSIS — E785 Hyperlipidemia, unspecified: Secondary | ICD-10-CM | POA: Insufficient documentation

## 2023-12-06 NOTE — Progress Notes (Signed)
 Medical Nutrition Therapy  Appointment Start time:  [phone]  Appointment End time:  1632  Primary concerns today: LDL lowering   Referral diagnosis: Employee Visit 2 Preferred learning style: no preference indicated Learning readiness: ready   NUTRITION ASSESSMENT   Anthropometrics   Wt 12/06/23: 184.5 lb Wt 10/04/23: 183.9 lb  Clinical Medical Hx: reviewed, HTN, HLD Medications: reviewed Labs: reviewed; 08/15/23: LDL 104 Notable Signs/Symptoms: none Food Allergies: none  Lifestyle & Dietary Hx  Pt reports he has worked toward adding beans into his diet daily, often swapping rice for beans.   Pt states since previous visit his shoulder and leg has healed, so he is able to get back to upper body strength training and running.   Pt reports he bought 3 seeds, including chia and flax, and has been adding a teaspoon of each to his greek yogurt in the mornings.   Pt states he started school back 3 weeks ago and has noticed sleep quality decrease since starting back and wants to work on sleep and stress.   Estimated daily fluid intake: 3L water Supplements: none Sleep: <7 hours (the last 3 weeks since school started back) Stress / self-care: moderate to high Current average weekly physical activity: exercise (stretch, strength rotation, 20 min cardio, abs) 5x/wk, may also do HIIT, boxing, sprint work.   24-Hr Dietary Recall First Meal: 9am: plain greek yogurt with granola, flax seeds, chia seeds, raspberries OR kodiak pancakes and egg whites and fruit Snack: none OR fruit Second Meal: daves bread PBJ and fruit OR salmon, beans, vegetables Snack: none Third Meal: salmon, pinto beans, vegetables OR tuna, black beans and vegetables Snack: 1/2 PBJ OR fruit Beverages: water, coconut water occasionally, 1 naked juice per week, 1 green tea   NUTRITION DIAGNOSIS  NB-1.1 Food and nutrition-related knowledge deficit As related to lack of prior education by a registered dietitian.  As  evidenced by pt report.   NUTRITION INTERVENTION  Nutrition education (E-1) on the following topics:   Sleep Impact of sleep on nutrition: Inadequate sleep can have significant impacts on nutrition by increasing appetite and cravings for high-calorie foods, altering food choices, and disrupting metabolic processes. This can lead to weight gain, poor glucose management, and impaired nutrient absorption. Additionally, sleep deprivation often results in reduced physical activity, further affecting overall health. Tips for better sleep: To improve sleep quality, maintain a consistent sleep schedule and create a relaxing bedtime routine. Optimize your sleep environment by keeping it cool, dark, and quiet, and limit screen time before bed. Be mindful of food and drink intake, engage in regular physical activity, and manage stress effectively. By prioritizing better sleep habits, you can positively influence your nutrition and overall well-being.  LDL Lowering Nutrition Therapy Soluble fiber and impact on lowering cholesterol: Sources: Oats, barley, beans, lentils, fruits (apples, oranges), vegetables (carrots, broccoli), and flaxseeds. Benefits: Soluble fiber reduces the absorption of cholesterol into your bloodstream. Choose Healthy Unsaturated Fats and Omega 3's. Sources: Olive oil, canola oil, avocados, nuts, and Fatty fish (salmon, trout), walnuts, flaxseeds, and chia seeds. Benefits: Helps raise HDL cholesterol and lower LDL cholesterol. Omega-3s help reduce triglycerides and raise HDL cholesterol. Limit Saturated Fats: Sources: Red meat, full-fat dairy products, butter, and coconut oil. Benefits: Reducing intake helps lower LDL cholesterol levels. Eat Plenty of Fruits and Vegetables. Benefits: High in fiber and antioxidants, which help lower LDL cholesterol. Eat Whole Grains. Sources: Brown rice, whole wheat bread, quinoa, and whole grain pasta. Benefits: Whole grains contain more fiber and  nutrients  than refined grains. Regular Physical Activity: Aim for at least 30 minutes of moderate-intensity exercise most days of the week.  Fiber Dietary fiber is essential for health and comes in two types: soluble and insoluble fiber. Soluble Fiber: Characteristics: Dissolves in water, forming a gel-like substance. Sources: Oats, nuts, seeds, beans, lentils, fruits (apples, citrus), and vegetables (carrots). Benefits: Regulates blood sugar, lowers LDL cholesterol, supports heart health, and aids in digestion by forming a gel that prevents diarrhea. Insoluble Fiber: Characteristics: Does not dissolve in water and adds bulk to stool. Sources: Whole grains, bran, nuts, seeds, vegetables (cauliflower, green beans), and fruits (apples with skin, berries). Benefits: Promotes regular bowel movements, aids in weight management, and prevents diverticular disease.  Stress and Cholesterol Hormonal Responses: During stress, the body releases hormones like cortisol and adrenaline. These hormones can increase triglyceride levels and promote the production of low-density lipoprotein (LDL) cholesterol, commonly referred to as bad cholesterol.  Handouts Provided Include (initial assessment) Plate Method  Learning Style & Readiness for Change Teaching method utilized: Visual & Auditory  Demonstrated degree of understanding via: Teach Back  Barriers to learning/adherence to lifestyle change: none  Assessment/Continuation of Previous Goals Established by Pt  Goal 1: add 1 T ground flax daily. - goal met, continue!  Goal 2: add beans/lentils most days. - goal met, continue!  Goal 3: add oats to your diet a few days per week. - goal not met, try out overnight oats for a new breakfast idea!  New Goals:   Try some new bean recipes! Personnel officer)  Work on sleep and stress habits. Aim for 7 hours of sleep as a minimum.   MONITORING & EVALUATION Dietary intake, weekly physical activity, and follow up in 4-6 months.    Next Steps  Patient is to call for questions.

## 2023-12-19 ENCOUNTER — Other Ambulatory Visit (HOSPITAL_COMMUNITY): Payer: Self-pay

## 2023-12-19 DIAGNOSIS — L7211 Pilar cyst: Secondary | ICD-10-CM | POA: Diagnosis not present

## 2023-12-19 DIAGNOSIS — L738 Other specified follicular disorders: Secondary | ICD-10-CM | POA: Diagnosis not present

## 2023-12-19 DIAGNOSIS — L700 Acne vulgaris: Secondary | ICD-10-CM | POA: Diagnosis not present

## 2023-12-19 MED ORDER — TRETINOIN 0.025 % EX CREA
1.0000 | TOPICAL_CREAM | Freq: Every evening | CUTANEOUS | 3 refills | Status: AC
  Filled 2023-12-19: qty 45, 30d supply, fill #0
  Filled 2024-01-26: qty 45, 30d supply, fill #1
  Filled 2024-03-22: qty 45, 30d supply, fill #2

## 2023-12-26 ENCOUNTER — Other Ambulatory Visit: Payer: Self-pay

## 2023-12-26 MED ORDER — COMIRNATY 30 MCG/0.3ML IM SUSY
0.3000 mL | PREFILLED_SYRINGE | Freq: Once | INTRAMUSCULAR | 0 refills | Status: AC
  Filled 2023-12-26: qty 0.3, 1d supply, fill #0

## 2024-01-01 DIAGNOSIS — F4320 Adjustment disorder, unspecified: Secondary | ICD-10-CM | POA: Diagnosis not present

## 2024-02-05 DIAGNOSIS — F4320 Adjustment disorder, unspecified: Secondary | ICD-10-CM | POA: Diagnosis not present

## 2024-05-07 ENCOUNTER — Ambulatory Visit: Admitting: Dietician
